# Patient Record
Sex: Female | Born: 1985 | Hispanic: No | Marital: Married | Smoking: Never smoker
Health system: Southern US, Community
[De-identification: ages and names within clinical notes are randomized; demographics above are authoritative.]

## PROBLEM LIST (undated history)

## (undated) DIAGNOSIS — Z202 Contact with and (suspected) exposure to infections with a predominantly sexual mode of transmission: Secondary | ICD-10-CM

## (undated) DIAGNOSIS — B009 Herpesviral infection, unspecified: Secondary | ICD-10-CM

## (undated) HISTORY — DX: Contact with and (suspected) exposure to infections with a predominantly sexual mode of transmission: Z20.2

## (undated) HISTORY — PX: WISDOM TOOTH EXTRACTION: SHX21

---

## 2008-06-09 DIAGNOSIS — Z202 Contact with and (suspected) exposure to infections with a predominantly sexual mode of transmission: Secondary | ICD-10-CM

## 2008-06-09 HISTORY — DX: Contact with and (suspected) exposure to infections with a predominantly sexual mode of transmission: Z20.2

## 2012-08-24 ENCOUNTER — Encounter: Payer: Self-pay | Admitting: Certified Nurse Midwife

## 2012-08-25 ENCOUNTER — Other Ambulatory Visit: Payer: Self-pay | Admitting: Certified Nurse Midwife

## 2012-08-25 NOTE — Telephone Encounter (Signed)
REFILL ON dEPO pROVERA SENT TO [facility].

## 2012-08-30 ENCOUNTER — Encounter: Payer: Self-pay | Admitting: Certified Nurse Midwife

## 2012-08-30 ENCOUNTER — Ambulatory Visit (INDEPENDENT_AMBULATORY_CARE_PROVIDER_SITE_OTHER): Payer: BC Managed Care – PPO | Admitting: Certified Nurse Midwife

## 2012-08-30 VITALS — BP 92/60 | HR 72 | Resp 16

## 2012-08-30 DIAGNOSIS — IMO0001 Reserved for inherently not codable concepts without codable children: Secondary | ICD-10-CM

## 2012-08-30 DIAGNOSIS — Z309 Encounter for contraceptive management, unspecified: Secondary | ICD-10-CM

## 2012-08-30 MED ORDER — LEVONORGEST-ETH ESTRAD 91-DAY 0.15-0.03 &0.01 MG PO TABS: 1.0000 | ORAL_TABLET | Freq: Every day | ORAL | Status: DC

## 2012-08-30 NOTE — Progress Notes (Addendum)
27 yo maaf [id number] here to discuss change of contraception from Depo Provera to oral contraception.  Has been on Depo for 9 months with spotting off and on all the time.  Also noted headaches and fatigue with Depo Provera.  Last Depo due 08-04-12. Patient reports spotting on 08-25-12 with negative pregnancy test. Plans to set phone for regularity of pills.  Interested in extended cycle pills due to dysmenorrhea in the past. Denies smoking,migraine headaches or DVT history.  O:Heathy female, WD WN           HPI: pertinent information  above Orientation appropriate x3  No exam today  Last aex 12-09-11 WNL  A: Healthy Female desires contraceptive change  P: Discussed risks and benefits with handout regarding oral contraceptives(has used in the past) Desires trial [employer] x2 refills with instruction and warning signs and symptoms given.  Questions addressed.  RV annual exam 7-14.   Face to face regarding contraceptive change   Time 20 minutes Reviewed, TL

## 2012-08-30 NOTE — Patient Instructions (Signed)
Hormonal Contraception Information Estrogen and progesterone (progestin) are hormones used in many forms of birth control (contraception). These 2 hormones make up most hormonal contraceptives. Hormonal contraceptives use either:   A combination of estrogen hormone and progesterone hormone in the form of a:  Pill. Typical pill packs include 21 days of active hormone pills and 7 days of non-hormonal pills.During the non-hormone week, you will have your period. There are certain types of pills that include more days of active hormones.  Patch. The patch is placed on the lower abdomen every week for 3 weeks, and not on the fourth week.  Vaginal ring. The ring is placed in the vagina and left there for 3 weeks, and removed for 1 week.  Progesterone alone in the form of a(n):  Pill. Hormone pills are taken every day of the cycle.  Intrauterine device (IUD). The IUD is inserted during a menstrual period and removed or replaced every 5 years or less.  Implant. Plastic rods are placed under the skin of the upper arm and are removed or replaced every 3 years or less.  Injection. The injection is given once every 90 days. Pregnancy can still occur with any of these hormonal contraceptive methods. If there is any suspicion of pregnancy, take a pregnancy test and talk to your caregiver. ESTROGEN AND PROGESTERONE CONTRACEPTIVES Estrogen and progesterone contraceptives can prevent pregnancy by:  Stopping the actions of other reproductive hormones.   Stopping the release of an egg (ovulation).  Changing the lining of the uterus. This change makes it more difficult for an egg to implant. Side effects from estrogen occur more often in the first 2 or 3 months. Talk to your caregiver about what side effects may affect you. If you develop persistent side effects or they are severe, talk to your caregiver. PROGESTERONE CONTRACEPTIVES Progesterone only contraceptives can prevent pregnancy by:   Blocking  ovulation.  Preventing the entry of sperm into the uterus by keeping the cervical mucus thick and sticky.  Slowing the action of fallopian tubes to slow sperm transport.  Changing the lining of the uterus. This change makes it more difficult for an egg to implant. Side effects of progesterone can vary. Talk to your caregiver about what side effects may affect you. If you develop persistent side effects or they are severe, talk to your caregiver. Document Released: 06/15/2007 Document Revised: 08/18/2011 Document Reviewed: 09/28/2010 [facility] Patient Information 2013 [place], [place].

## 2012-09-02 ENCOUNTER — Telehealth: Payer: Self-pay | Admitting: Certified Nurse Midwife

## 2012-09-02 NOTE — Telephone Encounter (Signed)
LEFT MESSAGE TO RETURN CALL TO OUR OFFICE. Holly Reeves

## 2012-09-02 NOTE — Telephone Encounter (Signed)
PT HAS A QUESTION CONCERNING HER BIRTH CONTROL. PT THOUGHT HER CYCLE WOULD START IN 3 WEEKS BUT SHE HAS STARTED TODAY. SHOULD SHE START THE NEW BC SEASONIQUE TODAY?

## 2012-09-03 NOTE — Telephone Encounter (Signed)
Patient called to get advise on starting birth control oral meds that have been ordered, seasonique. Patient missed Depo Shot  07/11/2012. Her last cycle was on 3/19  Wed.  Last week. Yesterday she had cramping with red bleeding and today also. While on depo it was always a dark color. Question should she go ahead and start Seasonique. ? Stated she did do a HGC was negative. Please advise. Fannie Knee  See your Epic note.

## 2012-09-05 NOTE — Telephone Encounter (Signed)
Pt can start the ocp, use condoms if sexually active for the first month

## 2012-09-06 NOTE — Telephone Encounter (Signed)
Patient notified of D. Leonard,cnm, response to birth control. Patient understands directions. sue

## 2012-12-09 ENCOUNTER — Ambulatory Visit: Payer: Self-pay | Admitting: Certified Nurse Midwife

## 2012-12-17 ENCOUNTER — Encounter: Payer: Self-pay | Admitting: Certified Nurse Midwife

## 2012-12-17 ENCOUNTER — Ambulatory Visit (INDEPENDENT_AMBULATORY_CARE_PROVIDER_SITE_OTHER): Payer: BC Managed Care – PPO | Admitting: Certified Nurse Midwife

## 2012-12-17 VITALS — BP 100/64 | HR 60 | Resp 16 | Ht 63.25 in | Wt 134.0 lb

## 2012-12-17 DIAGNOSIS — IMO0001 Reserved for inherently not codable concepts without codable children: Secondary | ICD-10-CM

## 2012-12-17 DIAGNOSIS — Z01419 Encounter for gynecological examination (general) (routine) without abnormal findings: Secondary | ICD-10-CM

## 2012-12-17 DIAGNOSIS — Z0000 Encounter for general adult medical examination without abnormal findings: Secondary | ICD-10-CM

## 2012-12-17 DIAGNOSIS — Z23 Encounter for immunization: Secondary | ICD-10-CM

## 2012-12-17 DIAGNOSIS — Z309 Encounter for contraceptive management, unspecified: Secondary | ICD-10-CM

## 2012-12-17 MED ORDER — LEVONORGEST-ETH ESTRAD 91-DAY 0.15-0.03 &0.01 MG PO TABS: 1.0000 | ORAL_TABLET | Freq: Every day | ORAL | Status: DC

## 2012-12-17 NOTE — Progress Notes (Signed)
27 y.o. [id number] Married African American Fe here for annual exam.  Periods  Normal, no issues. Contraception working well with extended cycle OCP. Desires GC,Chlamydia screening.  No health issues today.   Patient's last menstrual period was 12/13/2012.          Sexually active: yes  The current method of family planning is OCP (estrogen/progesterone).    Exercising: no  exercise Smoker:  no  Health Maintenance: Pap:  12-09-11 neg MMG:  none Colonoscopy:  none BMD:   none TDaP:  Unsure per patient feels she is up to date. Labs: none Self breast exam: not done   reports that she has never smoked. She does not have any smokeless tobacco history on file. She reports that she does not drink alcohol or use illicit drugs.  Past Medical History  Diagnosis Date  . Chlamydia contact, treated 2010    History reviewed. No pertinent past surgical history.  Current Outpatient Prescriptions  Medication Sig Dispense Refill  . Levonorgestrel-Ethinyl Estradiol (SEASONIQUE) 0.15-0.03 &0.01 MG tablet Take 1 tablet by mouth daily.  1 Package  1   No current facility-administered medications for this visit.    Family History  Problem Relation Age of Onset  . Hypertension Mother   . Hypertension Maternal Grandmother   . Heart disease Maternal Grandmother   . Heart disease Maternal Grandfather   . Depression Mother     ROS:  Pertinent items are noted in HPI.  Otherwise, a comprehensive ROS was negative.  Exam:   BP 100/64  Pulse 60  Resp 16  Ht 5' 3.25" (1.607 m)  Wt 134 lb (60.782 kg)  BMI 23.54 kg/m2  LMP 12/13/2012 Height: 5' 3.25" (160.7 cm)  Ht Readings from Last 3 Encounters:  12/17/12 5' 3.25" (1.607 m)    General appearance: alert, cooperative and appears stated age Head: Normocephalic, without obvious abnormality, atraumatic Neck: no adenopathy, supple, symmetrical, trachea midline and thyroid normal to inspection and palpation Lungs: clear to auscultation  bilaterally Breasts: normal appearance, no masses or tenderness, No nipple retraction or dimpling, No nipple discharge or bleeding, No axillary or supraclavicular adenopathy Heart: regular rate and rhythm Abdomen: soft, non-tender; no masses,  no organomegaly Extremities: extremities normal, atraumatic, no cyanosis or edema Skin: Skin color, texture, turgor normal. No rashes or lesions Lymph nodes: Cervical, supraclavicular, and axillary nodes normal. No abnormal inguinal nodes palpated Neurologic: Grossly normal   Pelvic: External genitalia:  no lesions              Urethra:  normal appearing urethra with no masses, tenderness or lesions              Bartholin's and Skene's: normal                 Vagina: normal appearing vagina with normal color and discharge, no lesions              Cervix: normal, non tender              Pap taken: no Bimanual Exam:  Uterus:  normal size, contour, position, consistency, mobility, non-tender and anteverted              Adnexa: normal adnexa and no mass, fullness, tenderness               Rectovaginal: Confirms               Anus:  normal sphincter tone, no lesions  A:  Well Woman with normal  exam  Contraception: OCP  STD screening  Gardasil candidate    P:   Reviewed health and wellness   Rx Seasonique see order  Lab: GC,Chlamydia  Discussed risks and benefits, request Gardasil series  Pap smear as per guidelines   pap smear not taken today  counseled on breast self exam, STD prevention, use and side effects of OCP's, adequate intake of calcium and vitamin D, diet and exercise  return annually or prn  An After Visit Summary was printed and given to the patient.  Reviewed, TL

## 2012-12-17 NOTE — Patient Instructions (Addendum)
General topics  Next pap or exam is  due in 1 year Take a Women's multivitamin Take 1200 mg. of calcium daily - prefer dietary If any concerns in interim to call back  Breast Self-Awareness Practicing breast self-awareness may pick up problems early, prevent significant medical complications, and possibly save your life. By practicing breast self-awareness, you can become familiar with how your breasts look and feel and if your breasts are changing. This allows you to notice changes early. It can also offer you some reassurance that your breast health is good. One way to learn what is normal for your breasts and whether your breasts are changing is to do a breast self-exam. If you find a lump or something that was not present in the past, it is best to contact your caregiver right away. Other findings that should be evaluated by your caregiver include nipple discharge, especially if it is bloody; skin changes or reddening; areas where the skin seems to be pulled in (retracted); or new lumps and bumps. Breast pain is seldom associated with cancer (malignancy), but should also be evaluated by a caregiver. BREAST SELF-EXAM The best time to examine your breasts is 5 7 days after your menstrual period is over.  ExitCare Patient Information 2013 [employer].   Exercise to Stay Healthy Exercise helps you become and stay healthy. EXERCISE IDEAS AND TIPS Choose exercises that:  You enjoy.  Fit into your day. You do not need to exercise really hard to be healthy. You can do exercises at a slow or medium level and stay healthy. You can:  Stretch before and after working out.  Try yoga, Pilates, or tai chi.  Lift weights.  Walk fast, swim, jog, run, climb stairs, bicycle, dance, or rollerskate.  Take aerobic classes. Exercises that burn about 150 calories:  Running 1  miles in 15 minutes.  Playing volleyball for 45 to 60 minutes.  Washing and waxing a car for 45 to 60  minutes.  Playing touch football for 45 minutes.  Walking 1  miles in 35 minutes.  Pushing a stroller 1  miles in 30 minutes.  Playing basketball for 30 minutes.  Raking leaves for 30 minutes.  Bicycling 5 miles in 30 minutes.  Walking 2 miles in 30 minutes.  Dancing for 30 minutes.  Shoveling snow for 15 minutes.  Swimming laps for 20 minutes.  Walking up stairs for 15 minutes.  Bicycling 4 miles in 15 minutes.  Gardening for 30 to 45 minutes.  Jumping rope for 15 minutes.  Washing windows or floors for 45 to 60 minutes. Document Released: 06/28/2010 Document Revised: 08/18/2011 Document Reviewed: 06/28/2010 [employer] Patient Information 2013 [employer].   Other topics ( that may be useful information):    Sexually Transmitted Disease Sexually transmitted disease (STD) refers to any infection that is passed from person to person during sexual activity. This may happen by way of saliva, semen, blood, vaginal mucus, or urine. Common STDs include:  Gonorrhea.  Chlamydia.  Syphilis.  HIV/AIDS.  Genital herpes.  Hepatitis B and C.  Trichomonas.  Human papillomavirus (HPV).  Pubic lice. CAUSES  An STD may be spread by bacteria, virus, or parasite. A person can get an STD by:  Sexual intercourse with an infected person.  Sharing sex toys with an infected person.  Sharing needles with an infected person.  Having intimate contact with the genitals, mouth, or rectal areas of an infected person. SYMPTOMS  Some people may not have any symptoms, but   they can still pass the infection to others. Different STDs have different symptoms. Symptoms include:  Painful or bloody urination.  Pain in the pelvis, abdomen, vagina, anus, throat, or eyes.  Skin rash, itching, irritation, growths, or sores (lesions). These usually occur in the genital or anal area.  Abnormal vaginal discharge.  Penile discharge in men.  Soft, flesh-colored skin growths in the  genital or anal area.  Fever.  Pain or bleeding during sexual intercourse.  Swollen glands in the groin area.  Yellow skin and eyes (jaundice). This is seen with hepatitis. DIAGNOSIS  To make a diagnosis, your caregiver may:  Take a medical history.  Perform a physical exam.  Take a specimen (culture) to be examined.  Examine a sample of discharge under a microscope.  Perform blood test TREATMENT   Chlamydia, gonorrhea, trichomonas, and syphilis can be cured with antibiotic medicine.  Genital herpes, hepatitis, and HIV can be treated, but not cured, with prescribed medicines. The medicines will lessen the symptoms.  Genital warts from HPV can be treated with medicine or by freezing, burning (electrocautery), or surgery. Warts may come back.  HPV is a virus and cannot be cured with medicine or surgery.However, abnormal areas may be followed very closely by your caregiver and may be removed from the cervix, vagina, or vulva through office procedures or surgery. If your diagnosis is confirmed, your recent sexual partners need treatment. This is true even if they are symptom-free or have a negative culture or evaluation. They should not have sex until their caregiver says it is okay. HOME CARE INSTRUCTIONS  All sexual partners should be informed, tested, and treated for all STDs.  Take your antibiotics as directed. Finish them even if you start to feel better.  Only take over-the-counter or prescription medicines for pain, discomfort, or fever as directed by your caregiver.  Rest.  Eat a balanced diet and drink enough fluids to keep your urine clear or pale yellow.  Do not have sex until treatment is completed and you have followed up with your caregiver. STDs should be checked after treatment.  Keep all follow-up appointments, Pap tests, and blood tests as directed by your caregiver.  Only use latex condoms and water-soluble lubricants during sexual activity. Do not use  petroleum jelly or oils.  Avoid alcohol and illegal drugs.  Get vaccinated for HPV and hepatitis. If you have not received these vaccines in the past, talk to your caregiver about whether one or both might be right for you.  Avoid risky sex practices that can break the skin. The only way to avoid getting an STD is to avoid all sexual activity.Latex condoms and dental dams (for oral sex) will help lessen the risk of getting an STD, but will not completely eliminate the risk. SEEK MEDICAL CARE IF:   You have a fever.  You have any new or worsening symptoms. Document Released: 08/16/2002 Document Revised: 08/18/2011 Document Reviewed: 08/23/2010 [employer] Patient Information 2013 [employer].    Domestic Abuse You are being battered or abused if someone close to you hits, pushes, or physically hurts you in any way. You also are being abused if you are forced into activities. You are being sexually abused if you are forced to have sexual contact of any kind. You are being emotionally abused if you are made to feel worthless or if you are constantly threatened. It is important to remember that help is available. No one has the right to abuse you. PREVENTION OF FURTHER   ABUSE  Learn the warning signs of danger. This varies with situations but may include: the use of alcohol, threats, isolation from friends and family, or forced sexual contact. Leave if you feel that violence is going to occur.  If you are attacked or beaten, report it to the police so the abuse is documented. You do not have to press charges. The police can protect you while you or the attackers are leaving. Get the officer's name and badge number and a copy of the report.  Find someone you can trust and tell them what is happening to you: your caregiver, a nurse, clergy member, close friend or family member. Feeling ashamed is natural, but remember that you have done nothing wrong. No one deserves abuse. Document Released:  05/23/2000 Document Revised: 08/18/2011 Document Reviewed: 08/01/2010 [employer] Patient Information 2013 [employer].    How Much is Too Much Alcohol? Drinking too much alcohol can cause injury, accidents, and health problems. These types of problems can include:   Car crashes.  Falls.  Family fighting (domestic violence).  Drowning.  Fights.  Injuries.  Burns.  Damage to certain organs.  Having a baby with birth defects. ONE DRINK CAN BE TOO MUCH WHEN YOU ARE:  Working.  Pregnant or breastfeeding.  Taking medicines. Ask your doctor.  Driving or planning to drive. If you or someone you know has a drinking problem, get help from a doctor.  Document Released: 03/22/2009 Document Revised: 08/18/2011 Document Reviewed: 03/22/2009 [employer] Patient Information 2013 [employer].   Smoking Hazards Smoking cigarettes is extremely bad for your health. Tobacco smoke has over 200 known poisons in it. There are over 60 chemicals in tobacco smoke that cause cancer. Some of the chemicals found in cigarette smoke include:   Cyanide.  Benzene.  Formaldehyde.  Methanol (wood alcohol).  Acetylene (fuel used in welding torches).  Ammonia. Cigarette smoke also contains the poisonous gases nitrogen oxide and carbon monoxide.  Cigarette smokers have an increased risk of many serious medical problems and Smoking causes approximately:  90% of all lung cancer deaths in men.  80% of all lung cancer deaths in women.  90% of deaths from chronic obstructive lung disease. Compared with nonsmokers, smoking increases the risk of:  Coronary heart disease by 2 to 4 times.  Stroke by 2 to 4 times.  Men developing lung cancer by 23 times.  Women developing lung cancer by 13 times.  Dying from chronic obstructive lung diseases by 12 times.  . Smoking is the most preventable cause of death and disease in our society.  WHY IS SMOKING ADDICTIVE?  Nicotine is the chemical  agent in tobacco that is capable of causing addiction or dependence.  When you smoke and inhale, nicotine is absorbed rapidly into the bloodstream through your lungs. Nicotine absorbed through the lungs is capable of creating a powerful addiction. Both inhaled and non-inhaled nicotine may be addictive.  Addiction studies of cigarettes and spit tobacco show that addiction to nicotine occurs mainly during the teen years, when young people begin using tobacco products. WHAT ARE THE BENEFITS OF QUITTING?  There are many health benefits to quitting smoking.   Likelihood of developing cancer and heart disease decreases. Health improvements are seen almost immediately.  Blood pressure, pulse rate, and breathing patterns start returning to normal soon after quitting. QUITTING SMOKING   American Lung Association - 1-800-LUNGUSA  American Cancer Society - 1-800-ACS-2345 Document Released: 07/03/2004 Document Revised: 08/18/2011 Document Reviewed: 03/07/2009 [employer] Patient Information 2013 [employer].   Stress Management Stress is a state of physical or mental tension that often results from changes in your life or normal routine. Some common causes of stress are:  Death of a loved one.  Injuries or severe illnesses.  Getting fired or changing jobs.  Moving into a new home. Other causes may be:  Sexual problems.  Business or financial losses.  Taking on a large debt.  Regular conflict with someone at home or at work.  Constant tiredness from lack of sleep. It is not just bad things that are stressful. It may be stressful to:  Win the lottery.  Get married.  Buy a new car. The amount of stress that can be easily tolerated varies from person to person. Changes generally cause stress, regardless of the types of change. Too much stress can affect your health. It may lead to physical or emotional problems. Too little stress (boredom) may also become stressful. SUGGESTIONS TO  REDUCE STRESS:  Talk things over with your family and friends. It often is helpful to share your concerns and worries. If you feel your problem is serious, you may want to get help from a professional counselor.  Consider your problems one at a time instead of lumping them all together. Trying to take care of everything at once may seem impossible. List all the things you need to do and then start with the most important one. Set a goal to accomplish 2 or 3 things each day. If you expect to do too many in a single day you will naturally fail, causing you to feel even more stressed.  Do not use alcohol or drugs to relieve stress. Although you may feel better for a short time, they do not remove the problems that caused the stress. They can also be habit forming.  Exercise regularly - at least 3 times per week. Physical exercise can help to relieve that "uptight" feeling and will relax you.  The shortest distance between despair and hope is often a good night's sleep.  Go to bed and get up on time allowing yourself time for appointments without being rushed.  Take a short "time-out" period from any stressful situation that occurs during the day. Close your eyes and take some deep breaths. Starting with the muscles in your face, tense them, hold it for a few seconds, then relax. Repeat this with the muscles in your neck, shoulders, hand, stomach, back and legs.  Take good care of yourself. Eat a balanced diet and get plenty of rest.  Schedule time for having fun. Take a break from your daily routine to relax. HOME CARE INSTRUCTIONS   Call if you feel overwhelmed by your problems and feel you can no longer manage them on your own.  Return immediately if you feel like hurting yourself or someone else. Document Released: 11/19/2000 Document Revised: 08/18/2011 Document Reviewed: 07/12/2007 [employer] Patient Information 2013 [employer].   

## 2012-12-22 LAB — IPS N GONORRHOEA AND CHLAMYDIA BY PCR

## 2012-12-23 ENCOUNTER — Telehealth: Payer: Self-pay

## 2012-12-23 NOTE — Telephone Encounter (Signed)
[email or web address]

## 2012-12-23 NOTE — Telephone Encounter (Signed)
Message copied by Eliezer Bottom on Thu Dec 23, 2012  9:16 AM ------      Message from: Verner Chol      Created: Thu Dec 23, 2012  8:08 AM       Notify GC,Chlamydia negative ------

## 2012-12-29 NOTE — Telephone Encounter (Signed)
Patient notified of GC/CHL see labs

## 2013-02-17 ENCOUNTER — Ambulatory Visit: Payer: BC Managed Care – PPO

## 2013-03-18 ENCOUNTER — Ambulatory Visit (INDEPENDENT_AMBULATORY_CARE_PROVIDER_SITE_OTHER): Payer: BC Managed Care – PPO | Admitting: *Deleted

## 2013-03-18 VITALS — BP 110/74 | HR 68 | Temp 98.8°F | Ht 63.25 in | Wt 137.0 lb

## 2013-03-18 DIAGNOSIS — Z23 Encounter for immunization: Secondary | ICD-10-CM

## 2013-03-18 NOTE — Progress Notes (Signed)
Patient ID: Holly Reeves, female   DOB: [birth year], 27 y.o.   MRN: [id number]  Pt arrived for 2nd Gardasil injection.  Pt denies complications from previous injection.  Pt tolerated well in right deltoid.  She will return after 04/19/13 for next injection.

## 2013-03-18 NOTE — Patient Instructions (Signed)
Please return after 04/19/13 for 3rd and last Gardasil injection.

## 2013-04-22 ENCOUNTER — Ambulatory Visit: Payer: BC Managed Care – PPO

## 2013-05-02 ENCOUNTER — Ambulatory Visit: Payer: BC Managed Care – PPO

## 2013-08-04 ENCOUNTER — Ambulatory Visit: Payer: Self-pay

## 2013-08-17 ENCOUNTER — Ambulatory Visit (INDEPENDENT_AMBULATORY_CARE_PROVIDER_SITE_OTHER): Payer: BC Managed Care – PPO | Admitting: *Deleted

## 2013-08-17 VITALS — BP 115/72 | HR 83 | Resp 18 | Wt 144.0 lb

## 2013-08-17 DIAGNOSIS — Z23 Encounter for immunization: Secondary | ICD-10-CM

## 2013-08-17 NOTE — Progress Notes (Signed)
3rd gardasil Injection given left deltoid IM. Pt tolerated injection well.

## 2013-12-23 ENCOUNTER — Ambulatory Visit: Payer: BC Managed Care – PPO | Admitting: Certified Nurse Midwife

## 2014-01-11 ENCOUNTER — Ambulatory Visit: Payer: BC Managed Care – PPO | Admitting: Certified Nurse Midwife

## 2014-01-11 ENCOUNTER — Encounter: Payer: Self-pay | Admitting: Certified Nurse Midwife

## 2014-04-21 ENCOUNTER — Ambulatory Visit: Payer: BC Managed Care – PPO | Admitting: Certified Nurse Midwife

## 2014-04-24 ENCOUNTER — Encounter (HOSPITAL_COMMUNITY): Payer: Self-pay | Admitting: *Deleted

## 2014-04-24 ENCOUNTER — Inpatient Hospital Stay (HOSPITAL_COMMUNITY): Payer: BC Managed Care – PPO

## 2014-04-24 ENCOUNTER — Telehealth: Payer: Self-pay | Admitting: Certified Nurse Midwife

## 2014-04-24 ENCOUNTER — Inpatient Hospital Stay (HOSPITAL_COMMUNITY)
Admission: AD | Admit: 2014-04-24 | Discharge: 2014-04-24 | Disposition: A | Discharge status: Home / Self Care | Payer: BC Managed Care – PPO | Source: Ambulatory Visit | Attending: Obstetrics and Gynecology | Admitting: Obstetrics and Gynecology

## 2014-04-24 DIAGNOSIS — O200 Threatened abortion: Secondary | ICD-10-CM | POA: Diagnosis not present

## 2014-04-24 DIAGNOSIS — Z3A13 13 weeks gestation of pregnancy: Secondary | ICD-10-CM | POA: Insufficient documentation

## 2014-04-24 DIAGNOSIS — O209 Hemorrhage in early pregnancy, unspecified: Secondary | ICD-10-CM | POA: Diagnosis present

## 2014-04-24 LAB — HCG, QUANTITATIVE, PREGNANCY: hCG, Beta Chain, Quant, S: 1955 m[IU]/mL — ABNORMAL HIGH (ref <5)

## 2014-04-24 LAB — URINALYSIS, ROUTINE W REFLEX MICROSCOPIC
BILIRUBIN URINE: NEGATIVE
Glucose, UA: NEGATIVE mg/dL
Ketones, ur: NEGATIVE mg/dL
Leukocytes, UA: NEGATIVE
Nitrite: NEGATIVE
PROTEIN: NEGATIVE mg/dL
Specific Gravity, Urine: >1.03 — ABNORMAL HIGH (ref 1.005–1.030)
UROBILINOGEN UA: 0.2 mg/dL (ref 0.0–1.0)
pH: 6 (ref 5.0–8.0)

## 2014-04-24 LAB — WET PREP, GENITAL
Trich, Wet Prep: NONE SEEN
Yeast Wet Prep HPF POC: NONE SEEN

## 2014-04-24 LAB — CBC
HCT: 39.3 % (ref 36.0–46.0)
Hemoglobin: 13.7 g/dL (ref 12.0–15.0)
MCH: 32.8 pg (ref 26.0–34.0)
MCHC: 34.9 g/dL (ref 30.0–36.0)
MCV: 94 fL (ref 78.0–100.0)
PLATELETS: 297 10*3/uL (ref 150–400)
RBC: 4.18 MIL/uL (ref 3.87–5.11)
RDW: 12.7 % (ref 11.5–15.5)
WBC: 4.4 10*3/uL (ref 4.0–10.5)

## 2014-04-24 LAB — POCT PREGNANCY, URINE: Preg Test, Ur: POSITIVE — AB

## 2014-04-24 LAB — URINE MICROSCOPIC-ADD ON

## 2014-04-24 LAB — ABO/RH: ABO/RH(D): O POS

## 2014-04-24 NOTE — MAU Note (Signed)
Pt 13 weeks and started with pink/brown spotting one week ago,  Bleeding progressed to red mod amt bleeding for the past three days.  Mild abd cramping; no problems when she voids.

## 2014-04-24 NOTE — Telephone Encounter (Signed)
Patient calling to report bleeding after a positive urine pregnancy test.

## 2014-04-24 NOTE — Discharge Instructions (Signed)
Threatened Miscarriage °A threatened miscarriage occurs when you have vaginal bleeding during your first 20 weeks of pregnancy but the pregnancy has not ended. If you have vaginal bleeding during this time, your health care provider will do tests to make sure you are still pregnant. If the tests show you are still pregnant and the developing baby (fetus) inside your womb (uterus) is still growing, your condition is considered a threatened miscarriage. °A threatened miscarriage does not mean your pregnancy will end, but it does increase the risk of losing your pregnancy (complete miscarriage). °CAUSES  °The cause of a threatened miscarriage is usually not known. If you go on to have a complete miscarriage, the most common cause is an abnormal number of chromosomes in the developing baby. Chromosomes are the structures inside cells that hold all your genetic material. °Some causes of vaginal bleeding that do not result in miscarriage include: °· Having sex. °· Having an infection. °· Normal hormone changes of pregnancy. °· Bleeding that occurs when an egg implants in your uterus. °RISK FACTORS °Risk factors for bleeding in early pregnancy include: °· Obesity. °· Smoking. °· Drinking excessive amounts of alcohol or caffeine. °· Recreational drug use. °SIGNS AND SYMPTOMS °· Light vaginal bleeding. °· Mild abdominal pain or cramps. °DIAGNOSIS  °If you have bleeding with or without abdominal pain before 20 weeks of pregnancy, your health care provider will do tests to check whether you are still pregnant. One important test involves using sound waves and a computer (ultrasound) to create images of the inside of your uterus. Other tests include an internal exam of your vagina and uterus (pelvic exam) and measurement of your baby's heart rate.  °You may be diagnosed with a threatened miscarriage if: °· Ultrasound testing shows you are still pregnant. °· Your baby's heart rate is strong. °· A pelvic exam shows that the  opening between your uterus and your vagina (cervix) is closed. °· Your heart rate and blood pressure are stable. °· Blood tests confirm you are still pregnant. °TREATMENT  °No treatments have been shown to prevent a threatened miscarriage from going on to a complete miscarriage. However, the right home care is important.  °HOME CARE INSTRUCTIONS  °· Make sure you keep all your appointments for prenatal care. This is very important. °· Get plenty of rest. °· Do not have sex or use tampons if you have vaginal bleeding. °· Do not douche. °· Do not smoke or use recreational drugs. °· Do not drink alcohol. °· Avoid caffeine. °SEEK MEDICAL CARE IF: °· You have light vaginal bleeding or spotting while pregnant. °· You have abdominal pain or cramping. °· You have a fever. °SEEK IMMEDIATE MEDICAL CARE IF: °· You have heavy vaginal bleeding. °· You have blood clots coming from your vagina. °· You have severe low back pain or abdominal cramps. °· You have fever, chills, and severe abdominal pain. °MAKE SURE YOU: °· Understand these instructions. °· Will watch your condition. °· Will get help right away if you are not doing well or get worse. °Document Released: 05/26/2005 Document Revised: 05/31/2013 Document Reviewed: 03/22/2013 °ExitCare® Patient Information ©2015 [employer]. This information is not intended to replace advice given to you by your health care provider. Make sure you discuss any questions you have with your health care provider. ° °Pelvic Rest °Pelvic rest is sometimes recommended for women when:  °· The placenta is partially or completely covering the opening of the cervix (placenta previa). °· There is bleeding between the   uterine wall and the amniotic sac in the first trimester (subchorionic hemorrhage). °· The cervix begins to open without labor starting (incompetent cervix, cervical insufficiency). °· The labor is too early (preterm labor). °HOME CARE INSTRUCTIONS °· Do not have sexual intercourse,  stimulation, or an orgasm. °· Do not use tampons, douche, or put anything in the vagina. °· Do not lift anything over 10 pounds (4.5 kg). °· Avoid strenuous activity or straining your pelvic muscles. °SEEK MEDICAL CARE IF:  °· You have any vaginal bleeding during pregnancy. Treat this as a potential emergency. °· You have cramping pain felt low in the stomach (stronger than menstrual cramps). °· You notice vaginal discharge (watery, mucus, or bloody). °· You have a low, dull backache. °· There are regular contractions or uterine tightening. °SEEK IMMEDIATE MEDICAL CARE IF: °You have vaginal bleeding and have placenta previa.  °Document Released: 09/20/2010 Document Revised: 08/18/2011 Document Reviewed: 09/20/2010 °ExitCare® Patient Information ©2015 [employer]. This information is not intended to replace advice given to you by your health care provider. Make sure you discuss any questions you have with your health care provider. ° °

## 2014-04-24 NOTE — Telephone Encounter (Signed)
Initially spoke to patient at 1215pm. Reports positive UPT at home and has had bleeding off an on since last Tuesday and wanted to know if this was normal?  Bleeding started as pinkish brown and is now more bloody. States pregnancy test was positive 3-4 weeks ago. LMP 01-19-14. G1 P1. Denies pain. Had some cramping a few days ago but has resolved now.  Denies symptoms of pregnancy, morning sickness is gone. States she is not showing.   Advised will need to review with Dr Edward JollySilva and call her back.  Reviewed with Dr Edward JollySilva. Based on EGA 13+3 weeks, patient needs to go to MAU for evaluation. Call to patient at 1244 and notified on MD instructions. Patient states she will go.  Routing to provider for final review. Patient agreeable to disposition. Will close encounter  CC: Holly Reeves CNM, FYI.

## 2014-04-24 NOTE — MAU Provider Note (Signed)
History     CSN: [id number]  Arrival date and time: 04/24/14 1752   First Provider Initiated Contact with Patient 04/24/14 1835      No chief complaint on file.  HPI Holly Reeves 28 y.o. [id number] @Unknown  presents to MAU complaining of vaginal bleeding.  LMP 01/20/14.  She has not established care for pregnancy.  HPT was positive early October.  6 days ago she started having spotting.  It has now become bleeding.  She is using 2 pads per day.  The blood is dark red.  She denies abdominal pain, nausea, vomiting, fever, weakness, dizziness, headache, dysuria.   OB History    Gravida Para Term Preterm AB TAB SAB Ectopic Multiple Living   3 1 1  2 1 1   1       Past Medical History  Diagnosis Date  . Chlamydia contact, treated 2010    Past Surgical History  Procedure Laterality Date  . Wisdom tooth extraction      Family History  Problem Relation Age of Onset  . Hypertension Mother   . Hypertension Maternal Grandmother   . Heart disease Maternal Grandmother   . Heart disease Maternal Grandfather   . Depression Mother     History  Substance Use Topics  . Smoking status: Never Smoker   . Smokeless tobacco: Not on file  . Alcohol Use: No    Allergies: No Known Allergies  Prescriptions prior to admission  Medication Sig Dispense Refill Last Dose  . Levonorgestrel-Ethinyl Estradiol (SEASONIQUE) 0.15-0.03 &0.01 MG tablet Take 1 tablet by mouth daily. 1 Package 4 More than a month at Unknown time    ROS Pertinent ROS in HPI Physical Exam   Last menstrual period 01/20/2014.  Physical Exam  Constitutional: She is oriented to person, place, and time. She appears well-developed and well-nourished.  HENT:  Head: Normocephalic and atraumatic.  Eyes: EOM are normal.  Neck: Normal range of motion.  Cardiovascular: Normal rate, regular rhythm and normal heart sounds.   Respiratory: Effort normal and breath sounds normal. No respiratory distress.  GI: Soft. Bowel  sounds are normal. She exhibits no distension. There is no tenderness. There is no rebound and no guarding.  Genitourinary:  Small amt of dark red/brown blood in vagina.  Malodor noted.   No CMT, no adnexal tenderness or mass noted.   Musculoskeletal: Normal range of motion.  Neurological: She is alert and oriented to person, place, and time.  Skin: Skin is warm and dry.  Psychiatric: She has a normal mood and affect.   Results for orders placed or performed during the hospital encounter of 04/24/14 (from the past 24 hour(s))  Urinalysis, Routine w reflex microscopic     Status: Abnormal   Collection Time: 04/24/14  6:15 PM  Result Value Ref Range   Color, Urine YELLOW YELLOW   APPearance CLEAR CLEAR   Specific Gravity, Urine >1.030 (H) 1.005 - 1.030   pH 6.0 5.0 - 8.0   Glucose, UA NEGATIVE NEGATIVE mg/dL   Hgb urine dipstick LARGE (A) NEGATIVE   Bilirubin Urine NEGATIVE NEGATIVE   Ketones, ur NEGATIVE NEGATIVE mg/dL   Protein, ur NEGATIVE NEGATIVE mg/dL   Urobilinogen, UA 0.2 0.0 - 1.0 mg/dL   Nitrite NEGATIVE NEGATIVE   Leukocytes, UA NEGATIVE NEGATIVE  Urine microscopic-add on     Status: Abnormal   Collection Time: 04/24/14  6:15 PM  Result Value Ref Range   Squamous Epithelial / LPF RARE RARE   WBC, UA 0-2 <  3 WBC/hpf   RBC / HPF 0-2 <3 RBC/hpf   Bacteria, UA FEW (A) RARE   Urine-Other MUCOUS PRESENT   Pregnancy, urine POC     Status: Abnormal   Collection Time: 04/24/14  7:09 PM  Result Value Ref Range   Preg Test, Ur POSITIVE (A) NEGATIVE  Wet prep, genital     Status: Abnormal   Collection Time: 04/24/14  7:30 PM  Result Value Ref Range   Yeast Wet Prep HPF POC NONE SEEN NONE SEEN   Trich, Wet Prep NONE SEEN NONE SEEN   Clue Cells Wet Prep HPF POC FEW (A) NONE SEEN   WBC, Wet Prep HPF POC FEW (A) NONE SEEN  CBC     Status: None   Collection Time: 04/24/14  7:45 PM  Result Value Ref Range   WBC 4.4 4.0 - 10.5 K/uL   RBC 4.18 3.87 - 5.11 MIL/uL   Hemoglobin 13.7  12.0 - 15.0 g/dL   HCT 16.1 09.6 - 04.5 %   MCV 94.0 78.0 - 100.0 fL   MCH 32.8 26.0 - 34.0 pg   MCHC 34.9 30.0 - 36.0 g/dL   RDW 40.9 81.1 - 91.4 %   Platelets 297 150 - 400 K/uL  ABO/Rh     Status: None (Preliminary result)   Collection Time: 04/24/14  7:45 PM  Result Value Ref Range   ABO/RH(D) O POS   hCG, quantitative, pregnancy     Status: Abnormal   Collection Time: 04/24/14  7:45 PM  Result Value Ref Range   hCG, Beta Chain, Quant, S 1955 (H) <5 mIU/mL   US Ob Comp Less 14 Wks  04/24/2014   CLINICAL DATA:  Vaginal bleeding  EXAM: OBSTETRIC <14 WK [place] AND TRANSVAGINAL OB US  TECHNIQUE: Both transabdominal and transvaginal ultrasound examinations were performed for complete evaluation of the gestation as well as the maternal uterus, adnexal regions, and pelvic cul-de-sac. Transvaginal technique was performed to assess early pregnancy.  COMPARISON:  None.  FINDINGS: Intrauterine gestational sac: An irregular area of decreased echogenicity is noted which may represent a gestational sac although it does not represent a normal appearing gestational sac. Given the patient's last menstrual period, gestational sac and fetal pole should be identified.  Maternal uterus/adnexae: Significant increased vascularity is noted surrounding the irregular hypoechoic structures in the endometrial canal. The ovaries are within normal limits bilaterally.  IMPRESSION: Irregular area of decreased echogenicity in the endometrial canal with significant increased vascularity. No definitive gestational sac is seen. Given the patient's current hemorrhage, this likely represents a miscarriage in progress. Correlation with the patient's pending beta HCG level is recommended. Followup examination could be performed as clinically indicated.   Electronically Signed   By: Alcide Clever M.D.   On: 04/24/2014 20:52   US Ob Transvaginal  04/24/2014   CLINICAL DATA:  Vaginal bleeding  EXAM: OBSTETRIC <14 WK [place] AND TRANSVAGINAL  OB US  TECHNIQUE: Both transabdominal and transvaginal ultrasound examinations were performed for complete evaluation of the gestation as well as the maternal uterus, adnexal regions, and pelvic cul-de-sac. Transvaginal technique was performed to assess early pregnancy.  COMPARISON:  None.  FINDINGS: Intrauterine gestational sac: An irregular area of decreased echogenicity is noted which may represent a gestational sac although it does not represent a normal appearing gestational sac. Given the patient's last menstrual period, gestational sac and fetal pole should be identified.  Maternal uterus/adnexae: Significant increased vascularity is noted surrounding the irregular hypoechoic structures in the endometrial  canal. The ovaries are within normal limits bilaterally.  IMPRESSION: Irregular area of decreased echogenicity in the endometrial canal with significant increased vascularity. No definitive gestational sac is seen. Given the patient's current hemorrhage, this likely represents a miscarriage in progress. Correlation with the patient's pending beta HCG level is recommended. Followup examination could be performed as clinically indicated.   Electronically Signed   By: Alcide CleverMark  Lukens M.D.   On: 04/24/2014 20:52   MAU Course  Procedures  MDM Pt's dates are certain and her periods are historically regular.  Per LMP she should be 13 weeks but no fetal heart tones and HCG is 1195.  U/S with irregular gest sac/no yolk sac or fetal pole.  Likely miscarriage in progress.  No evidence for infection.  Assessment and Plan  A:  1. Threatened abortion   2. Vaginal bleeding in pregnancy, first trimester    P: Discharge to home Pelvic rest Reasonable expectations discussed. Increase po fluids Return to MAU in 2 days for Quant HCG Patient may return to MAU as needed or if her condition were to change or worsen   Holly Reeves, Holly Reeves 04/24/2014, 6:35 PM

## 2014-04-25 LAB — HIV ANTIBODY (ROUTINE TESTING W REFLEX): HIV 1&2 Ab, 4th Generation: NONREACTIVE

## 2014-04-25 LAB — GC/CHLAMYDIA PROBE AMP
CT Probe RNA: NEGATIVE
GC Probe RNA: NEGATIVE

## 2014-04-26 ENCOUNTER — Ambulatory Visit: Payer: BC Managed Care – PPO | Admitting: Certified Nurse Midwife

## 2014-05-04 ENCOUNTER — Emergency Department (HOSPITAL_COMMUNITY)
Admission: EM | Admit: 2014-05-04 | Discharge: 2014-05-04 | Disposition: A | Discharge status: Home / Self Care | Payer: BC Managed Care – PPO | Attending: Emergency Medicine | Admitting: Emergency Medicine

## 2014-05-04 ENCOUNTER — Encounter (HOSPITAL_COMMUNITY): Payer: Self-pay

## 2014-05-04 DIAGNOSIS — O039 Complete or unspecified spontaneous abortion without complication: Secondary | ICD-10-CM | POA: Insufficient documentation

## 2014-05-04 DIAGNOSIS — O209 Hemorrhage in early pregnancy, unspecified: Secondary | ICD-10-CM | POA: Diagnosis present

## 2014-05-04 DIAGNOSIS — Z8619 Personal history of other infectious and parasitic diseases: Secondary | ICD-10-CM | POA: Diagnosis not present

## 2014-05-04 DIAGNOSIS — Z3A00 Weeks of gestation of pregnancy not specified: Secondary | ICD-10-CM | POA: Insufficient documentation

## 2014-05-04 LAB — CBC WITH DIFFERENTIAL/PLATELET
Basophils Absolute: 0 10*3/uL (ref 0.0–0.1)
Basophils Relative: 1 % (ref 0–1)
EOS ABS: 0 10*3/uL (ref 0.0–0.7)
Eosinophils Relative: 1 % (ref 0–5)
HCT: 38 % (ref 36.0–46.0)
Hemoglobin: 13.1 g/dL (ref 12.0–15.0)
LYMPHS ABS: 1.7 10*3/uL (ref 0.7–4.0)
LYMPHS PCT: 33 % (ref 12–46)
MCH: 32.1 pg (ref 26.0–34.0)
MCHC: 34.5 g/dL (ref 30.0–36.0)
MCV: 93.1 fL (ref 78.0–100.0)
Monocytes Absolute: 0.4 10*3/uL (ref 0.1–1.0)
Monocytes Relative: 7 % (ref 3–12)
Neutro Abs: 3.1 10*3/uL (ref 1.7–7.7)
Neutrophils Relative %: 58 % (ref 43–77)
PLATELETS: 304 10*3/uL (ref 150–400)
RBC: 4.08 MIL/uL (ref 3.87–5.11)
RDW: 12.3 % (ref 11.5–15.5)
WBC: 5.2 10*3/uL (ref 4.0–10.5)

## 2014-05-04 LAB — BASIC METABOLIC PANEL
Anion gap: 12 (ref 5–15)
BUN: 8 mg/dL (ref 6–23)
CO2: 26 mEq/L (ref 19–32)
Calcium: 9 mg/dL (ref 8.4–10.5)
Chloride: 102 mEq/L (ref 96–112)
Creatinine, Ser: 0.65 mg/dL (ref 0.50–1.10)
Glucose, Bld: 97 mg/dL (ref 70–99)
POTASSIUM: 3.5 meq/L — AB (ref 3.7–5.3)
SODIUM: 140 meq/L (ref 137–147)

## 2014-05-04 LAB — HCG, QUANTITATIVE, PREGNANCY: HCG, BETA CHAIN, QUANT, S: 208 m[IU]/mL — AB (ref <5)

## 2014-05-04 NOTE — ED Notes (Signed)
Pt was seen at MAU on 11/16.  Pt was confirmed pregnant, however she was bleeding.  Pt was advised it was possible a miscarriage.  Pt was advised to come back in 2 days for repeat labs, however she did not go for f/u.  Pt now presents today with c/o vaginal bleeding.

## 2014-05-04 NOTE — ED Provider Notes (Signed)
CSN: [id number]     Arrival date & time 05/04/14  0910 History   First MD Initiated Contact with Patient 05/04/14 [phone]     Chief Complaint  Patient presents with  . Vaginal Bleeding     (Consider location/radiation/quality/duration/timing/severity/associated sxs/prior Treatment) HPI  Barbaraann CaoKristin Cardenas is a 28 y.o. female who presents for evaluation of vaginal bleeding, like a menses, for 10 days.  The bleeding worsen last night, so she is concerned that she has had a miscarriage.  She was evaluated 04/24/2014 and diagnosed with pregnancy, and threatened abortion.  She decided not to follow-up because she felt like she was having a miscarriage at that time.  She denies weakness, dizziness, nausea, vomiting, fever, chills, back pain or vaginal discharge other than bleeding.  This is her second pregnancy, she has a six-year-old.  There are no other known modifying factors.   Past Medical History  Diagnosis Date  . Chlamydia contact, treated 2010   Past Surgical History  Procedure Laterality Date  . Wisdom tooth extraction     Family History  Problem Relation Age of Onset  . Hypertension Mother   . Hypertension Maternal Grandmother   . Heart disease Maternal Grandmother   . Heart disease Maternal Grandfather   . Depression Mother    History  Substance Use Topics  . Smoking status: Never Smoker   . Smokeless tobacco: Not on file  . Alcohol Use: No   OB History    Gravida Para Term Preterm AB TAB SAB Ectopic Multiple Living   3 1 1  2 1 1   1      Review of Systems  All other systems reviewed and are negative.     Allergies  Review of patient's allergies indicates no known allergies.  Home Medications   Prior to Admission medications   Medication Sig Start Date End Date Taking? Authorizing Provider  Prenatal Vit-Fe Fumarate-FA (PRENATAL MULTIVITAMIN) TABS tablet Take 1 tablet by mouth daily at 12 noon.   Yes Historical Provider, MD   BP 110/65 mmHg  Pulse 84   Temp(Src) 98.7 F (37.1 C) (Oral)  Resp 16  SpO2 100%  LMP 01/20/2014 (Exact Date) Physical Exam  Constitutional: She is oriented to person, place, and time. She appears well-developed and well-nourished. No distress.  HENT:  Head: Normocephalic and atraumatic.  Right Ear: External ear normal.  Left Ear: External ear normal.  Eyes: Conjunctivae and EOM are normal. Pupils are equal, round, and reactive to light.  Neck: Normal range of motion and phonation normal. Neck supple.  Cardiovascular: Normal rate, regular rhythm and normal heart sounds.   Pulmonary/Chest: Effort normal and breath sounds normal. She exhibits no bony tenderness.  Abdominal: Soft. There is no tenderness (no tenderness to palpation over the suprapubic and bilateral lower quadrants.). There is no guarding.  Musculoskeletal: Normal range of motion.  Neurological: She is alert and oriented to person, place, and time. No cranial nerve deficit or sensory deficit. She exhibits normal muscle tone. Coordination normal.  Skin: Skin is warm, dry and intact.  Psychiatric: She has a normal mood and affect. Her behavior is normal. Judgment and thought content normal.  Nursing note and vitals reviewed.   ED Course  Procedures (including critical care time)  Case Plan: Check HCG. If decreased appropriately, confirms SAB. Abdomen and vitals are benign. Doubt Pelvic exam needed as had one 11/16, with appropriate lab screening.  Medications - No data to display  Patient Vitals for the past 24 hrs:  BP Temp Temp src Pulse Resp SpO2  05/04/14 0930 110/65 mmHg - - 84 - 100 %  05/04/14 0919 119/72 mmHg 98.7 F (37.1 C) Oral 81 16 100 %    11:00 AM Reevaluation with update and discussion. After initial assessment and treatment, an updated evaluation reveals no further complaints.  She remains comfortable.  Findings discussed with patient, all questions answered.Mancel Bale. Olando Willems L    Labs Review Labs Reviewed  BASIC METABOLIC  PANEL - Abnormal; Notable for the following:    Potassium 3.5 (*)    All other components within normal limits  HCG, QUANTITATIVE, PREGNANCY - Abnormal; Notable for the following:    hCG, Beta Chain, Quant, S 208 (*)    All other components within normal limits  CBC WITH DIFFERENTIAL    Imaging Review No results found.   EKG Interpretation None      MDM   Final diagnoses:  Spontaneous abortion    Vaginal bleeding secondary to spontaneous abortion, confirmed by expected decrease in beta hCG, relative to last value obtained on 04/24/2014.  She is hemodynamically stable, and does not show evidence for retained products of conception on clinical exam.  She has a gynecologist available for follow-up care.   Nursing Notes Reviewed/ Care Coordinated Applicable Imaging Reviewed Interpretation of Laboratory Data incorporated into ED treatment  The patient appears reasonably screened and/or stabilized for discharge and I doubt any other medical condition or other [facility] requiring further screening, evaluation, or treatment in the ED at this time prior to discharge.  Plan: Home Medications- Advil, Tylenol; Home Treatments- rest, fluids; return here if the recommended treatment, does not improve the symptoms; Recommended follow up- GYN 1-2 week    Flint MelterElliott L Kishaun Erekson, MD 05/04/14 1104

## 2014-05-04 NOTE — Discharge Instructions (Signed)
Miscarriage A miscarriage is the sudden loss of an unborn baby (fetus) before the 20th week of pregnancy. Most miscarriages happen in the first 3 months of pregnancy. Sometimes, it happens before a woman even knows she is pregnant. A miscarriage is also called a "spontaneous miscarriage" or "early pregnancy loss." Having a miscarriage can be an emotional experience. Talk with your caregiver about any questions you may have about miscarrying, the grieving process, and your future pregnancy plans. CAUSES   Problems with the fetal chromosomes that make it impossible for the baby to develop normally. Problems with the baby's genes or chromosomes are most often the result of errors that occur, by chance, as the embryo divides and grows. The problems are not inherited from the parents.  Infection of the cervix or uterus.   Hormone problems.   Problems with the cervix, such as having an incompetent cervix. This is when the tissue in the cervix is not strong enough to hold the pregnancy.   Problems with the uterus, such as an abnormally shaped uterus, uterine fibroids, or congenital abnormalities.   Certain medical conditions.   Smoking, drinking alcohol, or taking illegal drugs.   Trauma.  Often, the cause of a miscarriage is unknown.  SYMPTOMS   Vaginal bleeding or spotting, with or without cramps or pain.  Pain or cramping in the abdomen or lower back.  Passing fluid, tissue, or blood clots from the vagina. DIAGNOSIS  Your caregiver will perform a physical exam. You may also have an ultrasound to confirm the miscarriage. Blood or urine tests may also be ordered. TREATMENT   Sometimes, treatment is not necessary if you naturally pass all the fetal tissue that was in the uterus. If some of the fetus or placenta remains in the body (incomplete miscarriage), tissue left behind may become infected and must be removed. Usually, a dilation and curettage (D and C) procedure is performed.  During a D and C procedure, the cervix is widened (dilated) and any remaining fetal or placental tissue is gently removed from the uterus.  Antibiotic medicines are prescribed if there is an infection. Other medicines may be given to reduce the size of the uterus (contract) if there is a lot of bleeding.  If you have Rh negative blood and your baby was Rh positive, you will need a Rh immunoglobulin shot. This shot will protect any future baby from having Rh blood problems in future pregnancies. HOME CARE INSTRUCTIONS   Your caregiver may order bed rest or may allow you to continue light activity. Resume activity as directed by your caregiver.  Have someone help with home and family responsibilities during this time.   Keep track of the number of sanitary pads you use each day and how soaked (saturated) they are. Write down this information.   Do not use tampons. Do not douche or have sexual intercourse until approved by your caregiver.   Only take over-the-counter or prescription medicines for pain or discomfort as directed by your caregiver.   Do not take aspirin. Aspirin can cause bleeding.   Keep all follow-up appointments with your caregiver.   If you or your partner have problems with grieving, talk to your caregiver or seek counseling to help cope with the pregnancy loss. Allow enough time to grieve before trying to get pregnant again.  SEEK IMMEDIATE MEDICAL CARE IF:   You have severe cramps or pain in your back or abdomen.  You have a fever.  You pass large blood clots (walnut-sized   or larger) ortissue from your vagina. Save any tissue for your caregiver to inspect.   Your bleeding increases.   You have a thick, bad-smelling vaginal discharge.  You become lightheaded, weak, or you faint.   You have chills.  MAKE SURE YOU:  Understand these instructions.  Will watch your condition.  Will get help right away if you are not doing well or get  worse. Document Released: 11/19/2000 Document Revised: 09/20/2012 Document Reviewed: 07/15/2011 Exit[employer] Patient Information 2015 [employer]. This information is not intended to replace advice given to you by your health care provider. Make sure you discuss any questions you have with your health care provider.  

## 2014-05-10 ENCOUNTER — Encounter: Payer: Self-pay | Admitting: Certified Nurse Midwife

## 2014-05-10 ENCOUNTER — Ambulatory Visit (INDEPENDENT_AMBULATORY_CARE_PROVIDER_SITE_OTHER): Payer: BC Managed Care – PPO | Admitting: Certified Nurse Midwife

## 2014-05-10 VITALS — BP 100/64 | HR 64 | Resp 16 | Ht 63.0 in | Wt 140.0 lb

## 2014-05-10 DIAGNOSIS — D649 Anemia, unspecified: Secondary | ICD-10-CM

## 2014-05-10 DIAGNOSIS — Z0000 Encounter for general adult medical examination without abnormal findings: Secondary | ICD-10-CM

## 2014-05-10 DIAGNOSIS — Z01419 Encounter for gynecological examination (general) (routine) without abnormal findings: Secondary | ICD-10-CM

## 2014-05-10 DIAGNOSIS — O039 Complete or unspecified spontaneous abortion without complication: Secondary | ICD-10-CM

## 2014-05-10 LAB — CBC WITH DIFFERENTIAL/PLATELET
BASOS ABS: 0 10*3/uL (ref 0.0–0.1)
BASOS PCT: 0 % (ref 0–1)
EOS ABS: 0.1 10*3/uL (ref 0.0–0.7)
Eosinophils Relative: 1 % (ref 0–5)
HCT: 31.8 % — ABNORMAL LOW (ref 36.0–46.0)
HEMOGLOBIN: 10.7 g/dL — AB (ref 12.0–15.0)
LYMPHS ABS: 1.9 10*3/uL (ref 0.7–4.0)
Lymphocytes Relative: 37 % (ref 12–46)
MCH: 31.4 pg (ref 26.0–34.0)
MCHC: 33.6 g/dL (ref 30.0–36.0)
MCV: 93.3 fL (ref 78.0–100.0)
MPV: 9.2 fL — ABNORMAL LOW (ref 9.4–12.4)
Monocytes Absolute: 0.5 10*3/uL (ref 0.1–1.0)
Monocytes Relative: 9 % (ref 3–12)
NEUTROS PCT: 53 % (ref 43–77)
Neutro Abs: 2.8 10*3/uL (ref 1.7–7.7)
PLATELETS: 317 10*3/uL (ref 150–400)
RBC: 3.41 MIL/uL — AB (ref 3.87–5.11)
RDW: 13.4 % (ref 11.5–15.5)
WBC: 5.2 10*3/uL (ref 4.0–10.5)

## 2014-05-10 LAB — HEMOGLOBIN, FINGERSTICK: HEMOGLOBIN, FINGERSTICK: 10.8 g/dL — AB (ref 12.0–16.0)

## 2014-05-10 LAB — HCG, QUANTITATIVE, PREGNANCY: hCG, Beta Chain, Quant, S: 16.7 m[IU]/mL

## 2014-05-10 MED ORDER — FUSION PLUS PO CAPS
1.0000 | ORAL_CAPSULE | Freq: Every day | ORAL | Status: DC
Start: 2014-05-10 — End: 2017-02-13

## 2014-05-10 NOTE — Progress Notes (Signed)
28 y.o. [id number] Married African American Fe here for annual exam. Recent spontaneous abortion which occurred on 05/03/14.. Was seen in ER 05/04/14 for heavy bleeding and second HCG was drawn, previous threatened abortion and was seen in MAU and given precautions. HCG was 1955, recent one on 05/04/14 was 208. Patient still having some bleeding today with occasional cramping. Bleeding light now "period like". Denies fever, chills or fever or abdominal pain. Cramping only with last bleeding episode on Monday. Feels well, eating well and drinking fluids. Emotionally OK. Was not planning pregnancy.  Patient's last menstrual period was 01/20/2014 (exact date).          Sexually active: Yes.    The current method of family planning is none.    Exercising: Yes.    cardio,lifting Smoker:  no  Health Maintenance: Pap:  12-09-11 neg MMG:  none Colonoscopy:  none BMD:   none TDaP:  2011 Labs: Hgb. 10.8 Self breast exam: not done   reports that she has never smoked. She does not have any smokeless tobacco history on file. She reports that she does not drink alcohol or use illicit drugs.  Past Medical History  Diagnosis Date  . Chlamydia contact, treated 2010    Past Surgical History  Procedure Laterality Date  . Wisdom tooth extraction      Current Outpatient Prescriptions  Medication Sig Dispense Refill  . Prenatal Vit-Fe Fumarate-FA (PRENATAL MULTIVITAMIN) TABS tablet Take 1 tablet by mouth daily at 12 noon.     No current facility-administered medications for this visit.    Family History  Problem Relation Age of Onset  . Hypertension Mother   . Hypertension Maternal Grandmother   . Heart disease Maternal Grandmother   . Heart disease Maternal Grandfather   . Depression Mother     ROS:  Pertinent items are noted in HPI.  Otherwise, a comprehensive ROS was negative.  Exam:   BP 100/64 mmHg  Pulse 64  Resp 16  Ht 5\' 3"  (1.6 m)  Wt 140 lb (63.504 kg)  BMI 24.81 kg/m2  LMP  01/20/2014 (Exact Date) Height: 5\' 3"  (160 cm)  Ht Readings from Last 3 Encounters:  05/10/14 5\' 3"  (1.6 m)  03/18/13 5' 3.25" (1.607 m)  12/17/12 5' 3.25" (1.607 m)    General appearance: alert, cooperative and appears stated age Head: Normocephalic, without obvious abnormality, atraumatic Neck: no adenopathy, supple, symmetrical, trachea midline and thyroid normal to inspection and palpation and non-palpable Lungs: clear to auscultation bilaterally Breasts: normal appearance, no masses or tenderness, No nipple retraction or dimpling, No nipple discharge or bleeding, No axillary or supraclavicular adenopathy Heart: regular rate and rhythm Abdomen: soft, non-tender; no masses,  no organomegaly Extremities: extremities normal, atraumatic, no cyanosis or edema Skin: Skin color, texture, turgor normal. No rashes or lesions Lymph nodes: Cervical, supraclavicular, and axillary nodes normal. No abnormal inguinal nodes palpated Neurologic: Grossly normal   Pelvic: External genitalia:  no lesions              Urethra:  normal appearing urethra with no masses, tenderness or lesions              Bartholin's and Skene's: normal                 Vagina: normal appearing vagina with normal color and small amount dark blood noted, no odor              Cervix: parous, large, slightly open, non tender blood noted from  cervix, no odor              Pap taken: No. Bimanual Exam:  Uterus:  enlarged, 10-12 weeks size non tender              Adnexa: normal adnexa and no mass, fullness, tenderness               Rectovaginal: Confirms               Anus:  normal appearance  A:  Well Woman with normal exam   Contraception OCP desired  Recent SAB with bleeding and involution still occurring ( see epic MAU and ER notes and US report)  Anemia  P:   Reviewed health and wellness pertinent to exam  Discussed can not start OCP until HCG levels are normal and uterine exam is normal. Discussed no sexual activity  for 4-6 weeks until bleeding has stopped, patient aware and does not plan to be sexually active until on contraception  Discussed findings of uterus and still resolving as hormonal level decreases. Discussed bleeding expectations and warning signs with bleeding and infection. Will need to advise if occurs. Voiced understanding. Patient will need another HCG quant. In one week. Patient to schedule.Also discussed PUS after levels are normal due to irregular appearance per US report. ( consulted with Dr. Edward JollySilva agreeable with plan)  Questions addressed. Discussed it is not unusual feel a sense of loss due to SAB and grieving is normal. Encouraged partner support.  Discussed increase of iron in diet and start on oral iron supplement.  Rx Fusion plus see order  Pap smear not taken  Today   counseled on breast self exam, STD prevention, HIV risk factors and prevention, use and side effects of OCP's, adequate intake of calcium and vitamin D, diet and exercise  return annually or prn  15 minutes spent with patient  in face to face counseling regarding SAB, bleeding, follow up and grieving the pregnancy...    Addendum of Stat lab HCG 16.7

## 2014-05-10 NOTE — Patient Instructions (Signed)
Miscarriage A miscarriage is the loss of an unborn baby (fetus) before the 20th week of pregnancy. The cause is often unknown.  HOME CARE  You may need to stay in bed (bed rest), or you may be able to do light activity. Go about activity as told by your doctor.  Have help at home.  Write down how many pads you use each day. Write down how soaked they are.  Do not use tampons. Do not wash out your vagina (douche) or have sex (intercourse) until your doctor approves.  Only take medicine as told by your doctor.  Do not take aspirin.  Keep all doctor visits as told.  If you or your partner have problems with grieving, talk to your doctor. You can also try counseling. Give yourself time to grieve before trying to get pregnant again. GET HELP RIGHT AWAY IF:  You have bad cramps or pain in your back or belly (abdomen).  You have a fever.  You pass large clumps of blood (clots) from your vagina that are walnut-sized or larger. Save the clumps for your doctor to see.  You pass large amounts of tissue from your vagina. Save the tissue for your doctor to see.  You have more bleeding.  You have thick, bad-smelling fluid (discharge) coming from the vagina.  You get lightheaded, weak, or you pass out (faint).  You have chills. MAKE SURE YOU:  Understand these instructions.  Will watch your condition.  Will get help right away if you are not doing well or get worse. Document Released: 08/18/2011 Document Reviewed: 08/18/2011 [facility] Patient Information 2015 [place]. This information is not intended to replace advice given to you by your health care provider. Make sure you discuss any questions you have with your health care provider.        General topics  Next pap or exam is  due in 1 year Take a Women's multivitamin Take 1200 mg. of calcium daily - prefer dietary If any concerns in interim to call back  Breast Self-Awareness Practicing breast self-awareness may pick  up problems early, prevent significant medical complications, and possibly save your life. By practicing breast self-awareness, you can become familiar with how your breasts look and feel and if your breasts are changing. This allows you to notice changes early. It can also offer you some reassurance that your breast health is good. One way to learn what is normal for your breasts and whether your breasts are changing is to do a breast self-exam. If you find a lump or something that was not present in the past, it is best to contact your caregiver right away. Other findings that should be evaluated by your caregiver include nipple discharge, especially if it is bloody; skin changes or reddening; areas where the skin seems to be pulled in (retracted); or new lumps and bumps. Breast pain is seldom associated with cancer (malignancy), but should also be evaluated by a caregiver. BREAST SELF-EXAM The best time to examine your breasts is 5 7 days after your menstrual period is over.  [employer] Patient Information 2013 [place].   Exercise to Stay Healthy Exercise helps you become and stay healthy. EXERCISE IDEAS AND TIPS Choose exercises that:  You enjoy.  Fit into your day. You do not need to exercise really hard to be healthy. You can do exercises at a slow or medium level and stay healthy. You can:  Stretch before and after working out.  Try yoga, Pilates, or tai  chi.  Lift weights.  Walk fast, swim, jog, run, climb stairs, bicycle, dance, or rollerskate.  Take aerobic classes. Exercises that burn about 150 calories:  Running 1  miles in 15 minutes.  Playing volleyball for 45 to 60 minutes.  Washing and waxing a car for 45 to 60 minutes.  Playing touch football for 45 minutes.  Walking 1  miles in 35 minutes.  Pushing a stroller 1  miles in 30 minutes.  Playing basketball for 30 minutes.  Raking leaves for 30 minutes.  Bicycling 5 miles in 30 minutes.  Walking 2  miles in 30 minutes.  Dancing for 30 minutes.  Shoveling snow for 15 minutes.  Swimming laps for 20 minutes.  Walking up stairs for 15 minutes.  Bicycling 4 miles in 15 minutes.  Gardening for 30 to 45 minutes.  Jumping rope for 15 minutes.  Washing windows or floors for 45 to 60 minutes. Document Released: 06/28/2010 Document Revised: 08/18/2011 Document Reviewed: 06/28/2010 [facility] Patient Information 2013 [place].   Other topics ( that may be useful information):    Sexually Transmitted Disease Sexually transmitted disease (STD) refers to any infection that is passed from person to person during sexual activity. This may happen by way of saliva, semen, blood, vaginal mucus, or urine. Common STDs include:  Gonorrhea.  Chlamydia.  Syphilis.  HIV/AIDS.  Genital herpes.  Hepatitis B and C.  Trichomonas.  Human papillomavirus (HPV).  Pubic lice. CAUSES  An STD may be spread by bacteria, virus, or parasite. A person can get an STD by:  Sexual intercourse with an infected person.  Sharing sex toys with an infected person.  Sharing needles with an infected person.  Having intimate contact with the genitals, mouth, or rectal areas of an infected person. SYMPTOMS  Some people may not have any symptoms, but they can still pass the infection to others. Different STDs have different symptoms. Symptoms include:  Painful or bloody urination.  Pain in the pelvis, abdomen, vagina, anus, throat, or eyes.  Skin rash, itching, irritation, growths, or sores (lesions). These usually occur in the genital or anal area.  Abnormal vaginal discharge.  Penile discharge in men.  Soft, flesh-colored skin growths in the genital or anal area.  Fever.  Pain or bleeding during sexual intercourse.  Swollen glands in the groin area.  Yellow skin and eyes (jaundice). This is seen with hepatitis. DIAGNOSIS  To make a diagnosis, your caregiver may:  Take a medical  history.  Perform a physical exam.  Take a specimen (culture) to be examined.  Examine a sample of discharge under a microscope.  Perform blood test TREATMENT   Chlamydia, gonorrhea, trichomonas, and syphilis can be cured with antibiotic medicine.  Genital herpes, hepatitis, and HIV can be treated, but not cured, with prescribed medicines. The medicines will lessen the symptoms.  Genital warts from HPV can be treated with medicine or by freezing, burning (electrocautery), or surgery. Warts may come back.  HPV is a virus and cannot be cured with medicine or surgery.However, abnormal areas may be followed very closely by your caregiver and may be removed from the cervix, vagina, or vulva through office procedures or surgery. If your diagnosis is confirmed, your recent sexual partners need treatment. This is true even if they are symptom-free or have a negative culture or evaluation. They should not have sex until their caregiver says it is okay. HOME CARE INSTRUCTIONS  All sexual partners should be informed, tested, and treated for all STDs.  Take your antibiotics as directed. Finish them even if you start to feel better.  Only take over-the-counter or prescription medicines for pain, discomfort, or fever as directed by your caregiver.  Rest.  Eat a balanced diet and drink enough fluids to keep your urine clear or pale yellow.  Do not have sex until treatment is completed and you have followed up with your caregiver. STDs should be checked after treatment.  Keep all follow-up appointments, Pap tests, and blood tests as directed by your caregiver.  Only use latex condoms and water-soluble lubricants during sexual activity. Do not use petroleum jelly or oils.  Avoid alcohol and illegal drugs.  Get vaccinated for HPV and hepatitis. If you have not received these vaccines in the past, talk to your caregiver about whether one or both might be right for you.  Avoid risky sex  practices that can break the skin. The only way to avoid getting an STD is to avoid all sexual activity.Latex condoms and dental dams (for oral sex) will help lessen the risk of getting an STD, but will not completely eliminate the risk. SEEK MEDICAL CARE IF:   You have a fever.  You have any new or worsening symptoms. Document Released: 08/16/2002 Document Revised: 08/18/2011 Document Reviewed: 08/23/2010 [facility] Patient Information 2013 [place].    Domestic Abuse You are being battered or abused if someone close to you hits, pushes, or physically hurts you in any way. You also are being abused if you are forced into activities. You are being sexually abused if you are forced to have sexual contact of any kind. You are being emotionally abused if you are made to feel worthless or if you are constantly threatened. It is important to remember that help is available. No one has the right to abuse you. PREVENTION OF FURTHER ABUSE  Learn the warning signs of danger. This varies with situations but may include: the use of alcohol, threats, isolation from friends and family, or forced sexual contact. Leave if you feel that violence is going to occur.  If you are attacked or beaten, report it to the police so the abuse is documented. You do not have to press charges. The police can protect you while you or the attackers are leaving. Get the officer's name and badge number and a copy of the report.  Find someone you can trust and tell them what is happening to you: your caregiver, a nurse, clergy member, close friend or family member. Feeling ashamed is natural, but remember that you have done nothing wrong. No one deserves abuse. Document Released: 05/23/2000 Document Revised: 08/18/2011 Document Reviewed: 08/01/2010 [facility] Patient Information 2013 [place].    How Much is Too Much Alcohol? Drinking too much alcohol can cause injury, accidents, and health problems. These types  of problems can include:   Car crashes.  Falls.  Family fighting (domestic violence).  Drowning.  Fights.  Injuries.  Burns.  Damage to certain organs.  Having a baby with birth defects. ONE DRINK CAN BE TOO MUCH WHEN YOU ARE:  Working.  Pregnant or breastfeeding.  Taking medicines. Ask your doctor.  Driving or planning to drive. If you or someone you know has a drinking problem, get help from a doctor.  Document Released: 03/22/2009 Document Revised: 08/18/2011 Document Reviewed: 03/22/2009 [facility] Patient Information 2013 [place].   Smoking Hazards Smoking cigarettes is extremely bad for your health. Tobacco smoke has over 200 known poisons in it. There are over 60 chemicals  in tobacco smoke that cause cancer. Some of the chemicals found in cigarette smoke include:   Cyanide.  Benzene.  Formaldehyde.  Methanol (wood alcohol).  Acetylene (fuel used in welding torches).  Ammonia. Cigarette smoke also contains the poisonous gases nitrogen oxide and carbon monoxide.  Cigarette smokers have an increased risk of many serious medical problems and Smoking causes approximately:  90% of all lung cancer deaths in men.  80% of all lung cancer deaths in women.  90% of deaths from chronic obstructive lung disease. Compared with nonsmokers, smoking increases the risk of:  Coronary heart disease by 2 to 4 times.  Stroke by 2 to 4 times.  Men developing lung cancer by 23 times.  Women developing lung cancer by 13 times.  Dying from chronic obstructive lung diseases by 12 times.  . Smoking is the most preventable cause of death and disease in our society.  WHY IS SMOKING ADDICTIVE?  Nicotine is the chemical agent in tobacco that is capable of causing addiction or dependence.  When you smoke and inhale, nicotine is absorbed rapidly into the bloodstream through your lungs. Nicotine absorbed through the lungs is capable of creating a powerful addiction.  Both inhaled and non-inhaled nicotine may be addictive.  Addiction studies of cigarettes and spit tobacco show that addiction to nicotine occurs mainly during the teen years, when young people begin using tobacco products. WHAT ARE THE BENEFITS OF QUITTING?  There are many health benefits to quitting smoking.   Likelihood of developing cancer and heart disease decreases. Health improvements are seen almost immediately.  Blood pressure, pulse rate, and breathing patterns start returning to normal soon after quitting. QUITTING SMOKING   American Lung Association - 1-800-LUNGUSA  American Cancer Society - 1-800-ACS-2345 Document Released: 07/03/2004 Document Revised: 08/18/2011 Document Reviewed: 03/07/2009 [facility] Patient Information 2013 [place].   Stress Management Stress is a state of physical or mental tension that often results from changes in your life or normal routine. Some common causes of stress are:  Death of a loved one.  Injuries or severe illnesses.  Getting fired or changing jobs.  Moving into a new home. Other causes may be:  Sexual problems.  Business or financial losses.  Taking on a large debt.  Regular conflict with someone at home or at work.  Constant tiredness from lack of sleep. It is not just bad things that are stressful. It may be stressful to:  Win the lottery.  Get married.  Buy a new car. The amount of stress that can be easily tolerated varies from person to person. Changes generally cause stress, regardless of the types of change. Too much stress can affect your health. It may lead to physical or emotional problems. Too little stress (boredom) may also become stressful. SUGGESTIONS TO REDUCE STRESS:  Talk things over with your family and friends. It often is helpful to share your concerns and worries. If you feel your problem is serious, you may want to get help from a professional counselor.  Consider your problems one at a  time instead of lumping them all together. Trying to take care of everything at once may seem impossible. List all the things you need to do and then start with the most important one. Set a goal to accomplish 2 or 3 things each day. If you expect to do too many in a single day you will naturally fail, causing you to feel even more stressed.  Do not use alcohol or drugs to relieve  stress. Although you may feel better for a short time, they do not remove the problems that caused the stress. They can also be habit forming.  Exercise regularly - at least 3 times per week. Physical exercise can help to relieve that "uptight" feeling and will relax you.  The shortest distance between despair and hope is often a good night's sleep.  Go to bed and get up on time allowing yourself time for appointments without being rushed.  Take a short "time-out" period from any stressful situation that occurs during the day. Close your eyes and take some deep breaths. Starting with the muscles in your face, tense them, hold it for a few seconds, then relax. Repeat this with the muscles in your neck, shoulders, hand, stomach, back and legs.  Take good care of yourself. Eat a balanced diet and get plenty of rest.  Schedule time for having fun. Take a break from your daily routine to relax. HOME CARE INSTRUCTIONS   Call if you feel overwhelmed by your problems and feel you can no longer manage them on your own.  Return immediately if you feel like hurting yourself or someone else. Document Released: 11/19/2000 Document Revised: 08/18/2011 Document Reviewed: 07/12/2007 [facility] Patient Information 2013 [place].

## 2014-05-12 ENCOUNTER — Telehealth: Payer: Self-pay | Admitting: Certified Nurse Midwife

## 2014-05-12 NOTE — Telephone Encounter (Signed)
Pt requesting lab results. States she was expecting a call yesterday.

## 2014-05-12 NOTE — Telephone Encounter (Signed)
Spoke with patient. Advised patient spoke with [name] CNM. Advised Hcg is now 16.7 and is dropping appropriately. Needs to keep follow up lab appointment for 12/9. Patient is agreeable and verbalizes understanding. States that bleeding has greatly decreased.  [name] CNM agree to plan?

## 2014-05-12 NOTE — Telephone Encounter (Signed)
[name] CNM please review and advise labs from 12/02.

## 2014-05-14 NOTE — Progress Notes (Signed)
Encounter reviewed by Dr. Conley SimmondsBrook Silva. Case discussed with me.  Repeat hCG in one week and ultrasound after hCG negative or if it is persistently positive.

## 2014-05-15 NOTE — Telephone Encounter (Signed)
yes

## 2014-05-17 ENCOUNTER — Other Ambulatory Visit (INDEPENDENT_AMBULATORY_CARE_PROVIDER_SITE_OTHER): Payer: BC Managed Care – PPO

## 2014-05-17 DIAGNOSIS — O039 Complete or unspecified spontaneous abortion without complication: Secondary | ICD-10-CM

## 2014-05-18 ENCOUNTER — Telehealth: Payer: Self-pay

## 2014-05-18 DIAGNOSIS — Z8759 Personal history of other complications of pregnancy, childbirth and the puerperium: Secondary | ICD-10-CM

## 2014-05-18 LAB — HCG, QUANTITATIVE, PREGNANCY: hCG, Beta Chain, Quant, S: 3.9 m[IU]/mL

## 2014-05-18 NOTE — Telephone Encounter (Signed)
-----   Message from Verner Choleborah S Leonard, CNM sent at 05/18/2014  8:07 AM EST ----- Notify patient that her HCG is now to less than 5 ( 3.9) which is normal.  We now need to schedule PUS per Dr. Edward JollySilva recommendation due [facility] Order in

## 2014-05-18 NOTE — Telephone Encounter (Signed)
Attempted to reach patient at number provided [phone]. No answer. Recording states "The person you have reached is not available at this time. Please try again later."

## 2014-05-18 NOTE — Telephone Encounter (Signed)
Spoke with patient. Advised patient of message as seen below from [name] CNM. Patient is agreeable and verbalizes understanding. Requesting morning PUS the week of Jan 4th. Appointment scheduled for 1/7 at 9am with 9:30am consult with Dr.Silva. Patient is agreeable to date and time. Order will need precert.  Cc: [name] for precert.  Routing to provider for final review. Patient agreeable to disposition. Will close encounter

## 2014-05-18 NOTE — Telephone Encounter (Signed)
Pr $15

## 2014-05-19 NOTE — Progress Notes (Signed)
Reviewed personally.  M. Suzanne Colin Norment, MD.  

## 2014-06-15 ENCOUNTER — Telehealth: Payer: Self-pay | Admitting: Obstetrics and Gynecology

## 2014-06-15 ENCOUNTER — Ambulatory Visit (INDEPENDENT_AMBULATORY_CARE_PROVIDER_SITE_OTHER): Payer: BLUE CROSS/BLUE SHIELD

## 2014-06-15 ENCOUNTER — Encounter: Payer: Self-pay | Admitting: Obstetrics and Gynecology

## 2014-06-15 ENCOUNTER — Ambulatory Visit (INDEPENDENT_AMBULATORY_CARE_PROVIDER_SITE_OTHER): Payer: BLUE CROSS/BLUE SHIELD | Admitting: Obstetrics and Gynecology

## 2014-06-15 VITALS — BP 112/60 | HR 80 | Ht 63.0 in | Wt 140.0 lb

## 2014-06-15 DIAGNOSIS — O039 Complete or unspecified spontaneous abortion without complication: Secondary | ICD-10-CM

## 2014-06-15 DIAGNOSIS — R938 Abnormal findings on diagnostic imaging of other specified body structures: Secondary | ICD-10-CM

## 2014-06-15 DIAGNOSIS — Z8759 Personal history of other complications of pregnancy, childbirth and the puerperium: Secondary | ICD-10-CM

## 2014-06-15 DIAGNOSIS — Z8742 Personal history of other diseases of the female genital tract: Secondary | ICD-10-CM

## 2014-06-15 DIAGNOSIS — R9389 Abnormal findings on diagnostic imaging of other specified body structures: Secondary | ICD-10-CM

## 2014-06-15 NOTE — Telephone Encounter (Signed)
Patient due to return in two weeks for follow up ultrasound. Dr.Silva would you like me to schedule ultrasound with Dr.Miller as next ultrasound will be with your return on 2/4.

## 2014-06-15 NOTE — Progress Notes (Signed)
Subjective  Patient is here today for ultrasound following SAB.  Had heavy bleeding on 05/03/14.  Prior to 11/25 was bleeding some for about 2 weeks.   Has had serial HCGs: 11/16 - 1955 11/26 - 208 12/2 - 16.7 12/9 - 3.9  Prior pelvic ultrasound on 04/24/14 at hospital.  CLINICAL DATA: Vaginal bleeding  EXAM: OBSTETRIC <14 WK US AND TRANSVAGINAL OB US  TECHNIQUE: Both transabdominal and transvaginal ultrasound examinations were performed for complete evaluation of the gestation as well as the maternal uterus, adnexal regions, and pelvic cul-de-sac. Transvaginal technique was performed to assess early pregnancy.  COMPARISON: None.  FINDINGS: Intrauterine gestational sac: An irregular area of decreased echogenicity is noted which may represent a gestational sac although it does not represent a normal appearing gestational sac. Given the patient's last menstrual period, gestational sac and fetal pole should be identified.  Maternal uterus/adnexae: Significant increased vascularity is noted surrounding the irregular hypoechoic structures in the endometrial canal. The ovaries are within normal limits bilaterally.  IMPRESSION: Irregular area of decreased echogenicity in the endometrial canal with significant increased vascularity. No definitive gestational sac is seen. Given the patient's current hemorrhage, this likely represents a miscarriage in progress. Correlation with the patient's pending beta HCG level is recommended. Followup examination could be performed as clinically indicated.   Electronically Signed  By: Alcide CleverMark Lukens M.D.  On: 04/24/2014 20:52  Patient states spotting on 12/28 or 12/29 which lasted about 2 days.  No pain or cramping.   Having unprotected intercourse during this time.  Last time about 12/31 or 1/1.  Not trying for pregnancy.   Objective  Pelvic ultrasound today - images and report reviewed with patient.   Uterus with  echogenic fluid filled area.  No embryo. Avascular area.  Ovaries normal.  No free fluid.      Assessment  Status post SAB.  Echogenic area in endometrial canal. Unprotected intercourse.   Plan  Quant HCG now.  Anticipated pelvic ultrasound in 2 weeks.  No unprotected intercourse.   25 minutes face to face time of which over 50% was spent in counseling.   After visit summary to patient.

## 2014-06-15 NOTE — Telephone Encounter (Signed)
Patient was seen today and thinks she is supposed to return in two weeks?

## 2014-06-16 LAB — HCG, QUANTITATIVE, PREGNANCY: hCG, Beta Chain, Quant, S: <2 m[IU]/mL

## 2014-06-16 NOTE — Telephone Encounter (Signed)
Phone call to discuss negative beta HCG result from yesterday and discuss doing ultrasound with Dr. Hyacinth MeekerMiller or having at the [facility] in two weeks.  Unable to discuss with patient or leave a message as "wireless customer is unavailable." No ability to leave a message for the patient.

## 2014-06-19 NOTE — Telephone Encounter (Signed)
-----   Message from Brook E Amundson de Gwenevere Ghaziarvalho E Silva, MD sent at 06/17/2014 1Royer1:46 AM EST ----- Please inform patient of negative pregnancy test. I was unable to reach the patient by phone.   She will need an ultrasound the week of 1/18 which can be done at the [facility] prior to 1/20 or can be done with Dr. Hyacinth MeekerMiller on 1/21 in my absence.   If Dr. Hyacinth MeekerMiller does the ultrasound, please let me know as I will need to discuss this case with her.

## 2014-06-19 NOTE — Telephone Encounter (Signed)
Attempted to reach patient at number provided [phone]. There was no answer and no ability to leave voicemail as recording states "The wireless customer you are trying to reach is unavailable."

## 2014-06-19 NOTE — Telephone Encounter (Signed)
Spoke with patient. Advised patient of message and results as seen below from Dr.Silva. Patient would like to schedule ultrasound here in the office with Dr.Miller at this time. Appointment scheduled for 1/21 at 2:30pm with 3pm consult with Dr.Miller. Agreeable to date and time.   Cc: Dr.Miller  Routing to provider for final review. Patient agreeable to disposition. Will close encounter

## 2014-06-29 ENCOUNTER — Other Ambulatory Visit: Payer: BLUE CROSS/BLUE SHIELD

## 2014-06-29 ENCOUNTER — Telehealth: Payer: Self-pay | Admitting: Obstetrics & Gynecology

## 2014-06-29 ENCOUNTER — Other Ambulatory Visit: Payer: BLUE CROSS/BLUE SHIELD | Admitting: Obstetrics & Gynecology

## 2014-06-29 NOTE — Telephone Encounter (Signed)
Patient (patient canceled appointment @ 1:45pm today she just got a call from her daughter's daycare she is sick/rd

## 2014-06-29 NOTE — Telephone Encounter (Signed)
Please call patient to schedule ultrasound appointment she had to cancel due to her daughter being sick.

## 2014-07-03 NOTE — Telephone Encounter (Signed)
Thank you. Encounter closed. 

## 2014-07-04 NOTE — Telephone Encounter (Signed)
Call to patient to discuss rescheduling PUS. No answer. No voicemail available.

## 2014-07-11 NOTE — Telephone Encounter (Signed)
Call to patient to reschedule PUS. No answer. No voicemail

## 2014-07-25 NOTE — Telephone Encounter (Signed)
Call to patient to discuss rescheduling PUS. No answer. No voicemail available.

## 2014-08-03 ENCOUNTER — Telehealth: Payer: Self-pay | Admitting: Obstetrics & Gynecology

## 2014-08-03 NOTE — Telephone Encounter (Signed)
Patient is ready to reschedule her PUS appointment. 

## 2014-08-03 NOTE — Telephone Encounter (Signed)
Call to patient. No answer. No voicemail available.

## 2014-08-08 ENCOUNTER — Telehealth: Payer: Self-pay | Admitting: Obstetrics & Gynecology

## 2014-08-08 NOTE — Telephone Encounter (Signed)
Spoke with patient. Patient would like to reschedule ultrasound at this time. Patient was last seen 06/15/2014 with Dr.Silva for ultrasound and was recommended to have follow up ultrasound in 2 weeks. Patient was unable to keep follow up appointment and would like to schedule at this time. Appointment scheduled for 3/10 at 11am with 11:30am consult with Dr.Silva. Patient is agreeable to date and time.  Routing to provider for final review. Patient agreeable to disposition. Will close encounter

## 2014-08-08 NOTE — Telephone Encounter (Signed)
Pt calling to reschedule ultrasound. Pt cancelled appointment in January.

## 2014-08-17 ENCOUNTER — Other Ambulatory Visit: Payer: BLUE CROSS/BLUE SHIELD

## 2014-08-17 ENCOUNTER — Other Ambulatory Visit: Payer: BLUE CROSS/BLUE SHIELD | Admitting: Obstetrics and Gynecology

## 2014-08-17 ENCOUNTER — Telehealth: Payer: Self-pay | Admitting: Obstetrics and Gynecology

## 2014-08-17 ENCOUNTER — Encounter: Payer: Self-pay | Admitting: Obstetrics and Gynecology

## 2014-08-17 NOTE — Telephone Encounter (Signed)
Patient dnka PUS/Consult appointment today. Dnka fee applied, letter prepared and message sent to Dr.Silva. Please call to reschedule.

## 2014-08-17 NOTE — Telephone Encounter (Signed)
DNKA protocol applied. Routing to [place] for rescheduling.

## 2014-08-17 NOTE — Telephone Encounter (Signed)
Patient called and stated she will not make it to her appointment for PUS today. When questioned why she will not keep the appointment she said, "I lost track of time."

## 2014-08-17 NOTE — Telephone Encounter (Signed)
DNKA protocol applies.

## 2014-08-18 ENCOUNTER — Encounter: Payer: Self-pay | Admitting: Obstetrics and Gynecology

## 2014-08-23 NOTE — Telephone Encounter (Signed)
Call to patient. Could not leave voicemail message. Will try again later

## 2014-08-30 NOTE — Telephone Encounter (Signed)
Call to patient in an attempt to reschedule PUS. Received the following message: °"The wireless customer you are trying to reach is not available. Please try again later." °

## 2014-09-13 NOTE — Telephone Encounter (Signed)
Call to patient in an attempt to reschedule PUS. Received the following message: "The wireless customer you are trying to reach is not available. Please try again later."

## 2014-09-22 ENCOUNTER — Telehealth: Payer: Self-pay | Admitting: *Deleted

## 2014-09-22 NOTE — Telephone Encounter (Signed)
Follow up call to patient regarding pelvic ultrasound to be scheduled. Left message to call back.

## 2014-09-25 NOTE — Telephone Encounter (Signed)
Patient returned call. Advised calling to follow-up on scheduling pelvic ultrasound recommended by Dr Edward JollySilva to re-evaluate endometrial lining. Patient agreeable to schedule. States she has had cycles the last two months, next one due 10-15-14. Scheduled PUS for 10-19-14 at 1130. Patient is instructed to call back if menses does not start on schedule.Patient agreeable.  Routing to provider for final review. Patient agreeable to disposition. Will close encounter

## 2014-10-16 ENCOUNTER — Telehealth: Payer: Self-pay | Admitting: Obstetrics and Gynecology

## 2014-10-16 NOTE — Telephone Encounter (Signed)
Left message to call Kaitlyn at [phone]. 

## 2014-10-16 NOTE — Telephone Encounter (Signed)
Pt would like to reschedule ultrasound for this Thursday to a later time on same day.

## 2014-10-17 NOTE — Telephone Encounter (Signed)
Spoke with patient. Advised patient her 11:30 am ultrasound with 12pm consult with Dr.Silva is the latest slot Dr.Silva has for ultrasounds that day. Patient is agreeable to keep appointment as scheduled. Patient states that she started her cycle today and is having light spotting. Advised okay to keep appointment as schedule as PUS can be performed on cycle. Advised if bleeding increases or she feel uncomfortable on Thursday to give the office a call to discuss rescheduling if needed. Patient is agreeable. Patient has some billing questions. Phone call passed to [employer] Harris.  Routing to provider for final review. Patient agreeable to disposition. Patient aware provider will review message and nurse will return call with any additional instructions or change of disposition. Will close encounter.

## 2014-10-19 ENCOUNTER — Other Ambulatory Visit: Payer: BLUE CROSS/BLUE SHIELD | Admitting: Obstetrics and Gynecology

## 2014-10-19 ENCOUNTER — Other Ambulatory Visit: Payer: BLUE CROSS/BLUE SHIELD

## 2014-10-19 ENCOUNTER — Telehealth: Payer: Self-pay

## 2014-10-19 NOTE — Telephone Encounter (Signed)
I am concerned about last minute cancellations, failed appointment previously, and lack of proceeding forward with evaluation of unusual changes seen in the endometrium of her prior ultrasound.   If she does not proceed with rescheduled appointment, needs 30 day letter.   [facility]

## 2014-10-19 NOTE — Telephone Encounter (Signed)
Spoke with patient at time of incoming call. Patient states that her cycle has gotten heavier over night and she would like to reschedule her appointment. Advised PUS can be performed while on cycle but patient does not feel comfortable. Appointment rescheduled for 5/26 at 11:30am with 12pm consult with Dr.Silva. Patient is agreeable to date and time.  Dr.Silva please review for late cancellation fee or waiving. Thank you.

## 2014-11-02 ENCOUNTER — Ambulatory Visit (INDEPENDENT_AMBULATORY_CARE_PROVIDER_SITE_OTHER): Payer: BLUE CROSS/BLUE SHIELD | Admitting: Obstetrics and Gynecology

## 2014-11-02 ENCOUNTER — Ambulatory Visit (INDEPENDENT_AMBULATORY_CARE_PROVIDER_SITE_OTHER): Payer: BLUE CROSS/BLUE SHIELD

## 2014-11-02 ENCOUNTER — Encounter: Payer: Self-pay | Admitting: Obstetrics and Gynecology

## 2014-11-02 VITALS — BP 98/58 | HR 64

## 2014-11-02 DIAGNOSIS — R938 Abnormal findings on diagnostic imaging of other specified body structures: Secondary | ICD-10-CM

## 2014-11-02 DIAGNOSIS — O039 Complete or unspecified spontaneous abortion without complication: Secondary | ICD-10-CM

## 2014-11-02 DIAGNOSIS — R9389 Abnormal findings on diagnostic imaging of other specified body structures: Secondary | ICD-10-CM

## 2014-11-02 NOTE — Progress Notes (Signed)
Subjective  29 y.o. [id number]  African American female here for pelvic ultrasound for evaluation of endometrium.  Had SAB and was seen for office pelvic ultrasound 06/15/14 which showed thickened endometrium.  Quant beta HCG 06/15/14 < 2.  Previous hospital ultrasound had commented on unusual appearance of her endometrium and increased vascularity. She delayed in her care until today to return to evaluate the endometrium despite recommendations for a prompt return.   Menses are monthly.  Last 4 days.  No heavy bleeding or significant cramping.  No bleeding in between menses.   Condoms for birth control.  Considering childbearing in the next two years.   Objective  Pelvic ultrasound images and report reviewed with patient.  Uterus - no masses. EMS - 9.05 mm. Ovaries - normal.  Left CL cyst.  Free fluid - yes, mild amount.     Assessment  Previous abnormal ultrasound finding.  Normal pelvic ultrasound.  History of SAB.   Plan  Explanation for reasoning behind evaluation of the endometrium based on the unusual verbage used in the hospital ultrasound.  Discussed concerns for retained products of conception and gestational trophoblasic disease as etiologies for the findings previously seen.  No evidence of gestational trophoblastic disease. May return to routine care with annual exams.   ____15___ minutes face to face time of which over 50% was spent in counseling.   After visit summary to patient.

## 2014-12-21 ENCOUNTER — Ambulatory Visit (INDEPENDENT_AMBULATORY_CARE_PROVIDER_SITE_OTHER): Payer: BLUE CROSS/BLUE SHIELD

## 2014-12-21 ENCOUNTER — Ambulatory Visit (INDEPENDENT_AMBULATORY_CARE_PROVIDER_SITE_OTHER): Payer: BLUE CROSS/BLUE SHIELD | Admitting: Obstetrics and Gynecology

## 2014-12-21 ENCOUNTER — Encounter: Payer: Self-pay | Admitting: Obstetrics and Gynecology

## 2014-12-21 VITALS — BP 110/60 | HR 80 | Resp 16 | Wt 140.0 lb

## 2014-12-21 DIAGNOSIS — O209 Hemorrhage in early pregnancy, unspecified: Secondary | ICD-10-CM

## 2014-12-21 DIAGNOSIS — Z8619 Personal history of other infectious and parasitic diseases: Secondary | ICD-10-CM

## 2014-12-21 DIAGNOSIS — N926 Irregular menstruation, unspecified: Secondary | ICD-10-CM | POA: Diagnosis not present

## 2014-12-21 DIAGNOSIS — A499 Bacterial infection, unspecified: Secondary | ICD-10-CM

## 2014-12-21 DIAGNOSIS — B9689 Other specified bacterial agents as the cause of diseases classified elsewhere: Secondary | ICD-10-CM

## 2014-12-21 DIAGNOSIS — N760 Acute vaginitis: Secondary | ICD-10-CM

## 2014-12-21 LAB — US OB TRANSVAGINAL

## 2014-12-21 LAB — POCT URINE PREGNANCY: Preg Test, Ur: POSITIVE — AB

## 2014-12-21 MED ORDER — METRONIDAZOLE 500 MG PO TABS: 500.0000 mg | ORAL_TABLET | Freq: Two times a day (BID) | ORAL | Status: DC

## 2014-12-21 NOTE — Patient Instructions (Signed)
Call with fever, worsening abdominal pain, bleeding through greater a pad an hour or any other concerns  You need close monitoring with any future pregnancy, call with +pregnancy test  F/U for blood work on Monday

## 2014-12-21 NOTE — Progress Notes (Signed)
Patient ID: Holly CaoKristin Reeves, female   DOB: [birth year], 29 y.o.   MRN: [id number] GYNECOLOGY  VISIT   HPI: 29 y.o.   Married  [place]   [id number] with Patient's last menstrual period was 10/20/2014.  EGA [redacted] week, 6 days. Positive home pregnancy 3 weeks ago, cycles are every 28 days.  She is here to confirm pregnancy. Patient is concerned because she had a gush of blood yesterday, since then spotting off and on.  She c/o severe cramping the night prior to the bleeding. She had some spotting today. No cramping today. Blood type is O+. H/O chlamydia after having her baby. SAB 11/15. On questioning the patient c/o a vaginal odor for the last 3 weeks, no increased d/c until yesterday with the bleeding.   GYNECOLOGIC HISTORY: Patient's last menstrual period was 10/20/2014. Contraception:None Menopausal hormone therapy: N/A Last mammogram: N/A Last pap smear: 12-09-11 WNL         OB History    Gravida Para Term Preterm AB TAB SAB Ectopic Multiple Living   4 1 1  2 1 1   1          There are no active problems to display for this patient.   Past Medical History  Diagnosis Date  . Chlamydia contact, treated 2010    Past Surgical History  Procedure Laterality Date  . Wisdom tooth extraction      Current Outpatient Prescriptions  Medication Sig Dispense Refill  . Iron-FA-B Cmp-C-Biot-Probiotic (FUSION PLUS) CAPS Take 1 capsule by mouth at bedtime. 30 capsule 2  . Prenatal Vit-Fe Fumarate-FA (PRENATAL MULTIVITAMIN) TABS tablet Take 1 tablet by mouth daily at 12 noon.     No current facility-administered medications for this visit.     ALLERGIES: Review of patient's allergies indicates no known allergies.  Family History  Problem Relation Age of Onset  . Hypertension Mother   . Hypertension Maternal Grandmother   . Heart disease Maternal Grandmother   . Heart disease Maternal Grandfather   . Depression Mother     History   Social History  . Marital Status:  Married    Spouse Name: N/A  . Number of Children: N/A  . Years of Education: N/A   Occupational History  . Not on file.   Social History Main Topics  . Smoking status: Never Smoker   . Smokeless tobacco: Never Used  . Alcohol Use: No  . Drug Use: No  . Sexual Activity:    Partners: Male    Birth Control/ Protection: None   Other Topics Concern  . Not on file   Social History Narrative    ROS:  Pertinent items are noted in HPI.  PHYSICAL EXAMINATION:    BP 110/60 mmHg  Pulse 80  Resp 16  Wt 140 lb (63.504 kg)  LMP 10/20/2014    General appearance: alert, cooperative and appears stated age Abdomen: soft, non-tender; bowel sounds normal; no masses,  no organomegaly  Pelvic: External genitalia:  no lesions              Urethra:  normal appearing urethra with no masses, tenderness or lesions              Bartholins and Skenes: normal                 Vagina: normal appearing vagina watery brown d/c noted, odor noted              Cervix: closed, appears to be  a small amount of tissue in the cervix Bimanual Exam:  Uterus:  Anteverted, mildly tender, minimally enlarged.  Adnexa: no masses or tenderness  Chaperone was present for exam.  Wet prep: ++Clue, no trich, rare WBC KOH: negative for yeast PH: 5  ASSESSMENT 1st trimester bleeding H/O chlamydia Bacterial vaginitis  PLAN  Ultrasound today most cw failed pregnancy, some material seen in the LUS Will check wet prep Discussed with the patient her risk of ectopic pregnancy and the need for early blood monitoring and ultrasound   An After Visit Summary was printed and given to the patient.     Addendum: ultrasound cw likely SAB, appears to have gestational sac remnants in the LUS Will check a BhcG now and on Monday Patient to call with fevers, heavy bleeding, or any other concerns

## 2014-12-22 LAB — HCG, QUANTITATIVE, PREGNANCY: hCG, Beta Chain, Quant, S: 614.3 m[IU]/mL

## 2014-12-25 ENCOUNTER — Other Ambulatory Visit: Payer: BLUE CROSS/BLUE SHIELD

## 2014-12-25 ENCOUNTER — Telehealth: Payer: Self-pay | Admitting: Obstetrics and Gynecology

## 2014-12-25 NOTE — Telephone Encounter (Signed)
Patient canceled her hcg quant appointment and would like to reschedule. Patient is not of the time frame she is allowed for this lab. Last seen 12/21/14.

## 2014-12-25 NOTE — Telephone Encounter (Signed)
Call to patient. She was to have hcg quant today. HCG Quant 12/21/14 was 614.3.  Patient states she absolutely cannot come today for lab work due to work commitments. She denies complaints. No fevers, no abdominal pain. She states her bleeding is "very light".   Patient scheduled for lab appointment tomorrow morning at 0845, advised patient very important to keep lab appointment to ensure levels are dropping appropriately. She is advised to call back if develops heavy bleeding, fevers, or any abdominal pain.  Patient verbalized understanding. Advised would return call if any additional instructions from Dr. Oscar LaJertson.   Reviewed with Dr. Oscar LaJertson, okay for lab appointment for tomorrow at [phone] as scheduled.

## 2014-12-26 ENCOUNTER — Other Ambulatory Visit (INDEPENDENT_AMBULATORY_CARE_PROVIDER_SITE_OTHER): Payer: BLUE CROSS/BLUE SHIELD

## 2014-12-26 DIAGNOSIS — O209 Hemorrhage in early pregnancy, unspecified: Secondary | ICD-10-CM

## 2014-12-27 ENCOUNTER — Other Ambulatory Visit: Payer: Self-pay | Admitting: Obstetrics and Gynecology

## 2014-12-27 DIAGNOSIS — O209 Hemorrhage in early pregnancy, unspecified: Secondary | ICD-10-CM

## 2014-12-27 LAB — HCG, QUANTITATIVE, PREGNANCY: hCG, Beta Chain, Quant, S: 91.3 m[IU]/mL

## 2014-12-28 ENCOUNTER — Telehealth: Payer: Self-pay

## 2014-12-28 NOTE — Telephone Encounter (Signed)
Patient called back. Results given and she scheduled lab appt for 01/03/15.

## 2014-12-28 NOTE — Telephone Encounter (Signed)
Left message to call Kaitlyn at [phone].  Notes Recorded by Romualdo Bolk, MD on 12/27/2014 at 10:22 AM Please inform the patient that her BhcG is coming down, cw pregnancy loss. I would recommend that we check weekly BhcG's until they are negative given her h/o ectopic pregnancy. U/S was suggestive of SAB, but not definitive. Order placed for next week.

## 2014-12-29 NOTE — Telephone Encounter (Signed)
Spoke with patient. Patient states that she was told yesterday that she has a "history of ectopic pregnancies." Advised of notes from Dr.Jertson from 12/21/2014 appointment. Patient is agreeable. Advised there is a risk for ectopic pregnancy not that she has one at this time. Advised of importance of monitoring her Hcg levels to ensure they decline appropriately. Patient is agreeable. Has follow up lab appointment scheduled for 01/03/2015 at 1:30pm. Patient is agreeable to date and time.  PLAN  Ultrasound today most cw failed pregnancy, some material seen in the LUS Will check wet prep Discussed with the patient her risk of ectopic pregnancy and the need for early blood monitoring and ultrasound  An After Visit Summary was printed and given to the patient.  Routing to provider for final review. Patient agreeable to disposition. Will close encounter.   Patient aware provider will review message and nurse will return call if any additional advice or change of disposition.

## 2014-12-29 NOTE — Telephone Encounter (Signed)
Left message to call Kaitlyn at [phone]. 

## 2014-12-29 NOTE — Telephone Encounter (Signed)
Returning call.

## 2015-01-03 ENCOUNTER — Other Ambulatory Visit (INDEPENDENT_AMBULATORY_CARE_PROVIDER_SITE_OTHER): Payer: BLUE CROSS/BLUE SHIELD

## 2015-01-03 DIAGNOSIS — O209 Hemorrhage in early pregnancy, unspecified: Secondary | ICD-10-CM

## 2015-01-04 LAB — HCG, QUANTITATIVE, PREGNANCY: hCG, Beta Chain, Quant, S: 5.6 m[IU]/mL

## 2015-01-08 ENCOUNTER — Telehealth: Payer: Self-pay | Admitting: Obstetrics and Gynecology

## 2015-01-08 ENCOUNTER — Telehealth: Payer: Self-pay | Admitting: *Deleted

## 2015-01-08 NOTE — Telephone Encounter (Signed)
Patient says she is returning a call to Ebony. No open tc note.

## 2015-01-08 NOTE — Telephone Encounter (Signed)
LMTC -eh  

## 2015-01-08 NOTE — Telephone Encounter (Signed)
Lorri Frederick, CMA at 01/08/2015 8:51 AM     Status: Signed       Expand All Collapse All   LMTC -eh             Lorri Frederick, CMA at 01/08/2015 8:51 AM     Status: Signed       Expand All Collapse All   ----- Message from Romualdo Bolk, MD sent at 01/04/2015 6:48 PM EDT ----- Please inform the patient that her Bhcg has dropped nicely, I don't think we have to check another one. She should call us early on with another pregnancy for blood work and an early ultrasound.       Spoke with patient. Results given as seen above. Patient is agreeable and verbalizes understanding.  Routing to provider for final review. Patient agreeable to disposition. Will close encounter.   Patient aware provider will review message and nurse will return call if any additional advice or change of disposition.

## 2015-01-08 NOTE — Telephone Encounter (Signed)
-----   Message from Romualdo Bolk, MD sent at 01/04/2015  6:48 PM EDT ----- Please inform the patient that her Bhcg has dropped nicely, I don't think we have to check another one. She should call us early on with another pregnancy for blood work and an early ultrasound.

## 2015-03-08 ENCOUNTER — Encounter: Payer: Self-pay | Admitting: Obstetrics and Gynecology

## 2015-04-26 ENCOUNTER — Encounter: Payer: Self-pay | Admitting: Obstetrics and Gynecology

## 2015-05-11 ENCOUNTER — Ambulatory Visit: Payer: BLUE CROSS/BLUE SHIELD | Admitting: Certified Nurse Midwife

## 2015-05-17 ENCOUNTER — Encounter (HOSPITAL_COMMUNITY): Payer: Self-pay | Admitting: Emergency Medicine

## 2015-05-17 ENCOUNTER — Emergency Department (HOSPITAL_COMMUNITY)
Admission: EM | Admit: 2015-05-17 | Discharge: 2015-05-17 | Disposition: A | Discharge status: Home / Self Care | Payer: No Typology Code available for payment source | Attending: Emergency Medicine | Admitting: Emergency Medicine

## 2015-05-17 DIAGNOSIS — S299XXA Unspecified injury of thorax, initial encounter: Secondary | ICD-10-CM | POA: Insufficient documentation

## 2015-05-17 DIAGNOSIS — Z792 Long term (current) use of antibiotics: Secondary | ICD-10-CM | POA: Diagnosis not present

## 2015-05-17 DIAGNOSIS — Y998 Other external cause status: Secondary | ICD-10-CM | POA: Diagnosis not present

## 2015-05-17 DIAGNOSIS — Z8619 Personal history of other infectious and parasitic diseases: Secondary | ICD-10-CM | POA: Insufficient documentation

## 2015-05-17 DIAGNOSIS — Y9389 Activity, other specified: Secondary | ICD-10-CM | POA: Diagnosis not present

## 2015-05-17 DIAGNOSIS — Y92410 Unspecified street and highway as the place of occurrence of the external cause: Secondary | ICD-10-CM | POA: Diagnosis not present

## 2015-05-17 DIAGNOSIS — S79922A Unspecified injury of left thigh, initial encounter: Secondary | ICD-10-CM | POA: Insufficient documentation

## 2015-05-17 DIAGNOSIS — Z79899 Other long term (current) drug therapy: Secondary | ICD-10-CM | POA: Diagnosis not present

## 2015-05-17 DIAGNOSIS — S199XXA Unspecified injury of neck, initial encounter: Secondary | ICD-10-CM | POA: Insufficient documentation

## 2015-05-17 MED ORDER — IBUPROFEN 600 MG PO TABS: 600.0000 mg | ORAL_TABLET | Freq: Four times a day (QID) | ORAL | Status: DC | PRN

## 2015-05-17 NOTE — ED Notes (Signed)
Placed pt in gown

## 2015-05-17 NOTE — Discharge Instructions (Signed)
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
## 2015-05-19 IMAGING — US US OB TRANSVAGINAL
1 series · 13 of 28 positions shown · non-contrast
Comparison: None.

CLINICAL DATA: Vaginal bleeding

EXAM:
OBSTETRIC <14 WK US AND TRANSVAGINAL OB US
TECHNIQUE: Both transabdominal and transvaginal ultrasound examinations were
performed for complete evaluation of the gestation as well as the
maternal uterus, adnexal regions, and pelvic cul-de-sac.
Transvaginal technique was performed to assess early pregnancy.

[Series 1: us ob comp less 14 wks · 46 acquisitions, 13 frames shown]
[im 2/46]
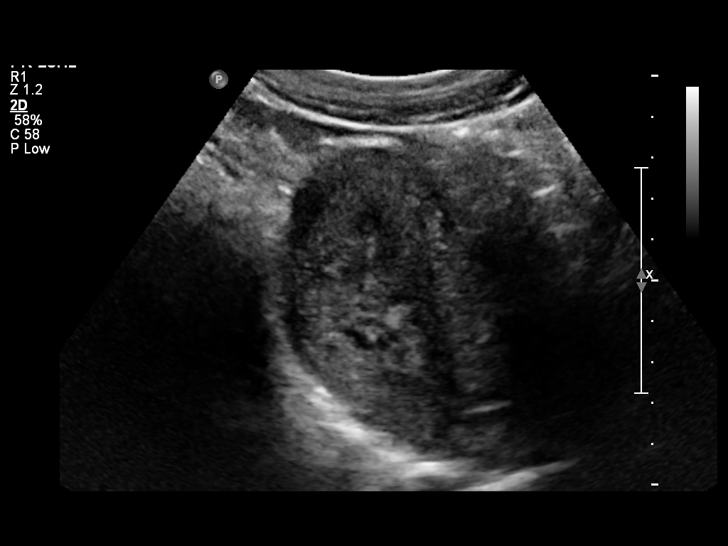
[im 6/46]
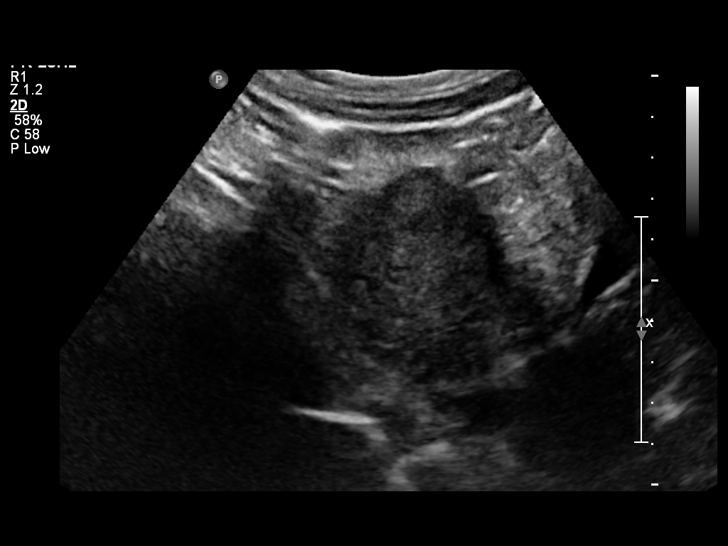
[im 9/46]
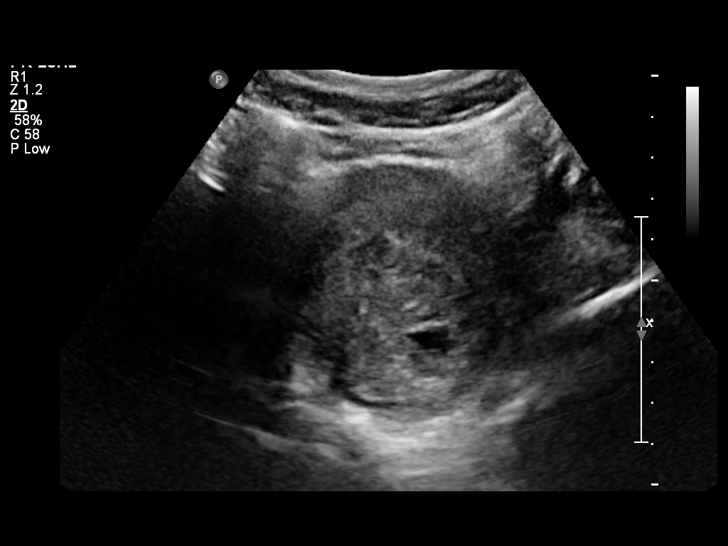
[im 12/46]
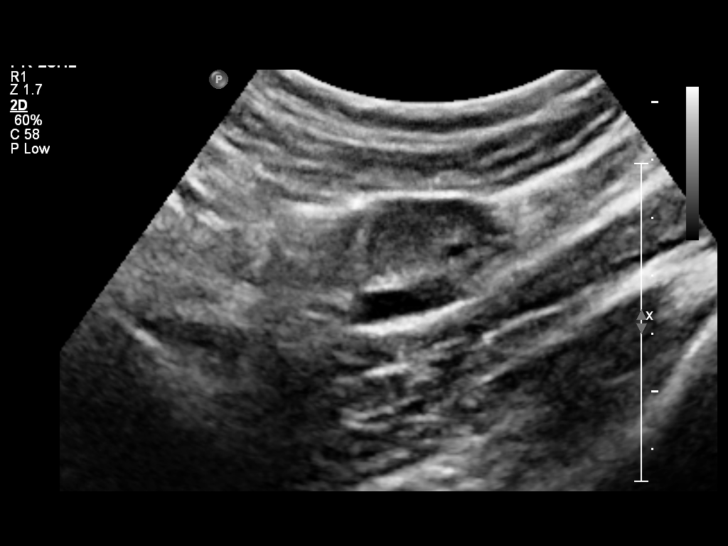
[im 16/46]
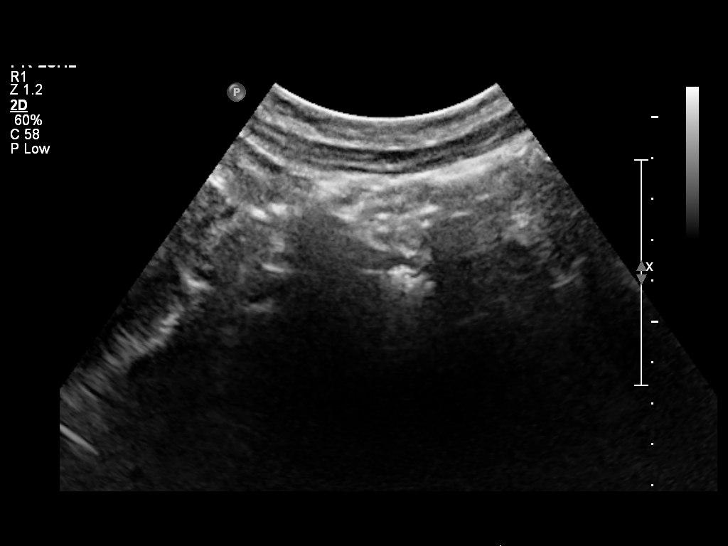
[im 19/46]
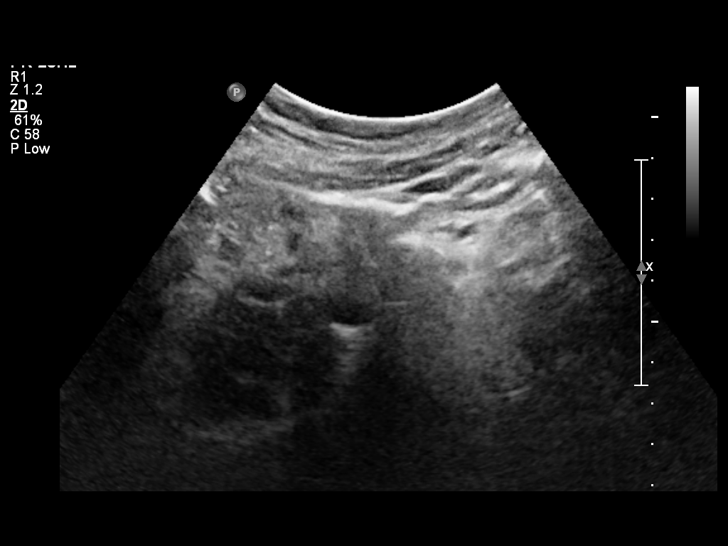
[im 24/46]
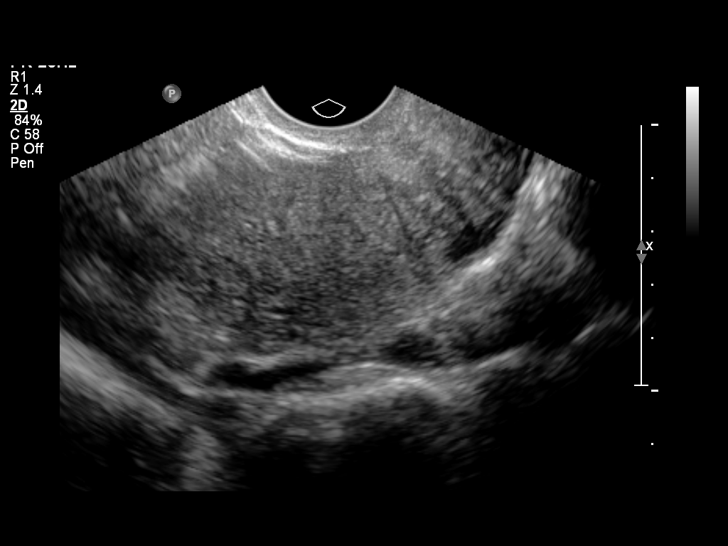
[im 27/46]
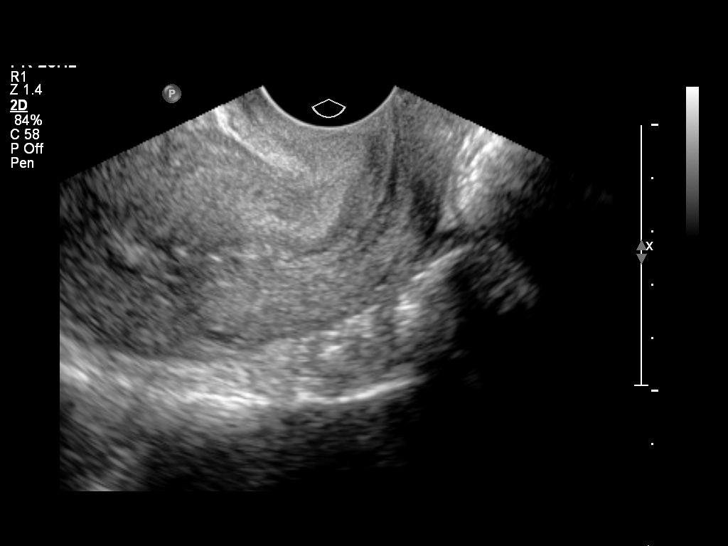
[im 31/46]
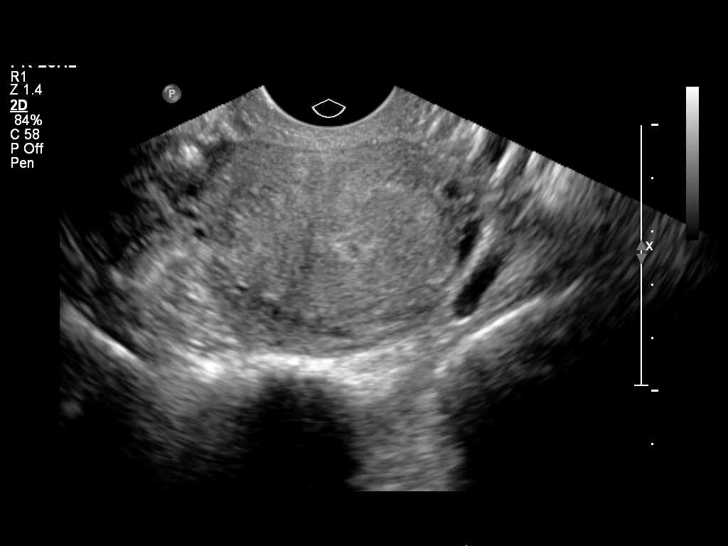
[im 34/46]
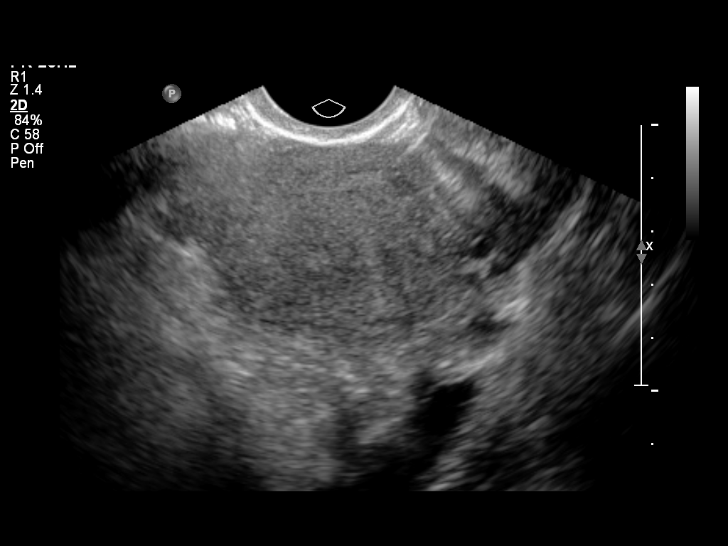
[im 37/46]
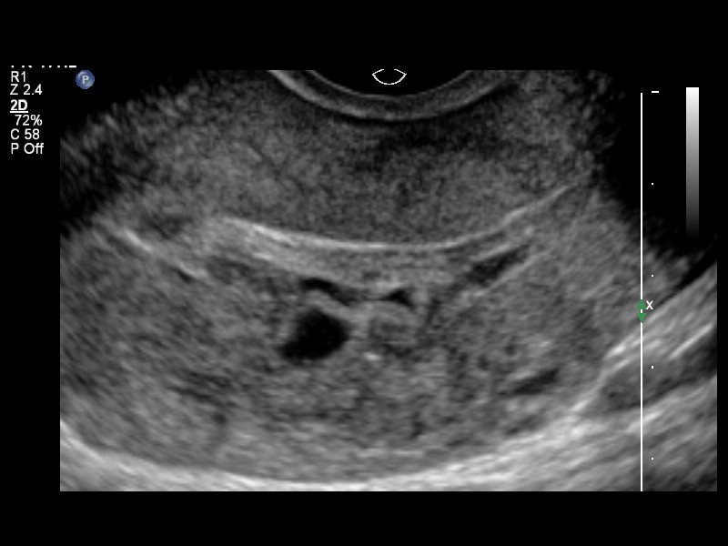
[im 41/46]
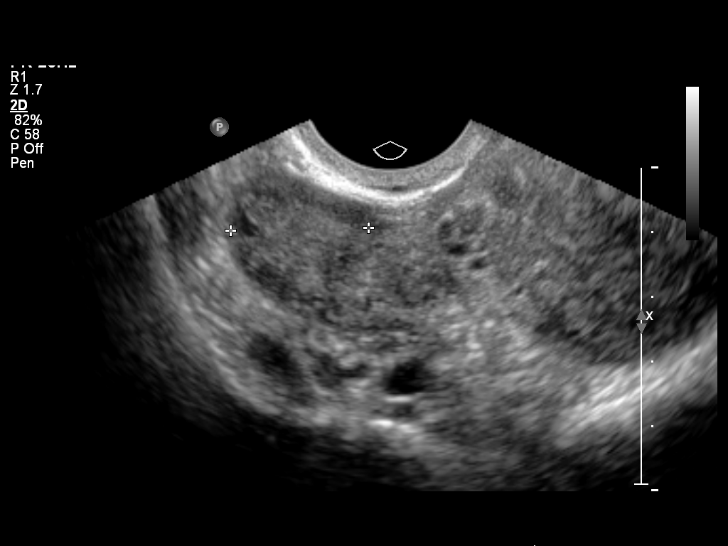
[im 44/46]
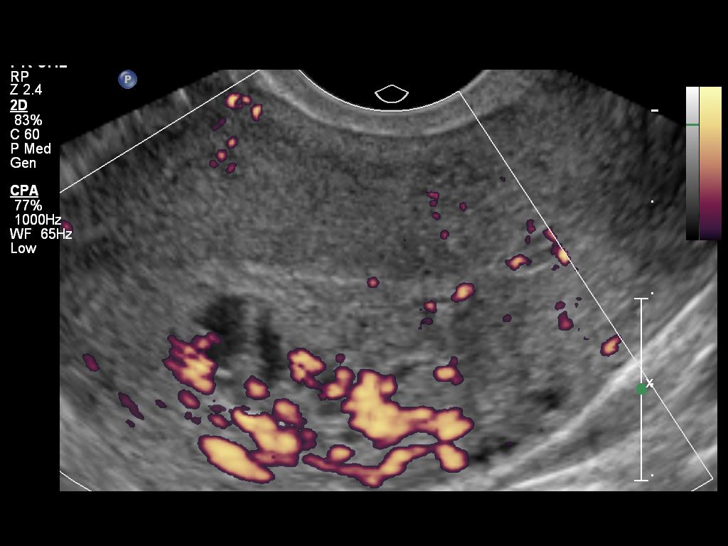

[13 of 28 positions shown; findings below may reference images not displayed]

FINDINGS: Intrauterine gestational sac: An irregular area of decreased
echogenicity is noted which may represent a gestational sac although
it does not represent a normal appearing gestational sac. Given the
patient's last menstrual period, gestational sac and fetal pole
should be identified.

Maternal uterus/adnexae: Significant increased vascularity is noted
surrounding the irregular hypoechoic structures in the endometrial
canal. The ovaries are within normal limits bilaterally.
IMPRESSION: Irregular area of decreased echogenicity in the endometrial canal
with significant increased vascularity. No definitive gestational
sac is seen. Given the patient's current hemorrhage, this likely
represents a miscarriage in progress. Correlation with the patient's
pending beta HCG level is recommended. Followup examination could be
performed as clinically indicated.

## 2015-06-10 NOTE — L&D Delivery Note (Signed)
Delivery Note At  a viable and healthy female was delivered via  (Presentation: Right Occiput  ; Anterior ).  APGAR: 8, 9; weight pending  .   Placenta status: spontaneous, intact.  Cord: 3V with loose nuchal x 1 reduced on the perineum    Anesthesia:  Epidural Episiotomy:  None Lacerations:  None Suture Repair: NA Est. Blood Loss (mL):  50 cc  Mom to postpartum.  Baby to Couplet care / Skin to Skin.  Holly Reeves H. 01/08/2016, 4:02 PM

## 2015-07-25 LAB — OB RESULTS CONSOLE GBS: STREP GROUP B AG: POSITIVE

## 2016-01-08 ENCOUNTER — Encounter (HOSPITAL_COMMUNITY): Payer: Self-pay

## 2016-01-08 ENCOUNTER — Inpatient Hospital Stay (HOSPITAL_COMMUNITY)
Admission: AD | Admit: 2016-01-08 | Discharge: 2016-01-10 | DRG: 774 | Disposition: A | Discharge status: Home / Self Care | Payer: BLUE CROSS/BLUE SHIELD | Source: Ambulatory Visit | Attending: Obstetrics and Gynecology | Admitting: Obstetrics and Gynecology

## 2016-01-08 ENCOUNTER — Inpatient Hospital Stay (HOSPITAL_COMMUNITY): Payer: BLUE CROSS/BLUE SHIELD | Admitting: Anesthesiology

## 2016-01-08 DIAGNOSIS — Z8249 Family history of ischemic heart disease and other diseases of the circulatory system: Secondary | ICD-10-CM | POA: Diagnosis not present

## 2016-01-08 DIAGNOSIS — Z818 Family history of other mental and behavioral disorders: Secondary | ICD-10-CM | POA: Diagnosis not present

## 2016-01-08 DIAGNOSIS — Z3483 Encounter for supervision of other normal pregnancy, third trimester: Secondary | ICD-10-CM | POA: Diagnosis present

## 2016-01-08 DIAGNOSIS — O99824 Streptococcus B carrier state complicating childbirth: Secondary | ICD-10-CM | POA: Diagnosis present

## 2016-01-08 DIAGNOSIS — O9832 Other infections with a predominantly sexual mode of transmission complicating childbirth: Secondary | ICD-10-CM | POA: Diagnosis present

## 2016-01-08 DIAGNOSIS — Z3490 Encounter for supervision of normal pregnancy, unspecified, unspecified trimester: Secondary | ICD-10-CM

## 2016-01-08 DIAGNOSIS — Z3A40 40 weeks gestation of pregnancy: Secondary | ICD-10-CM | POA: Diagnosis not present

## 2016-01-08 DIAGNOSIS — A6000 Herpesviral infection of urogenital system, unspecified: Secondary | ICD-10-CM | POA: Diagnosis present

## 2016-01-08 LAB — TYPE AND SCREEN
ABO/RH(D): O POS
ANTIBODY SCREEN: NEGATIVE

## 2016-01-08 LAB — CBC
HEMATOCRIT: 35.5 % — AB (ref 36.0–46.0)
HEMOGLOBIN: 11.9 g/dL — AB (ref 12.0–15.0)
MCH: 27.7 pg (ref 26.0–34.0)
MCHC: 33.5 g/dL (ref 30.0–36.0)
MCV: 82.8 fL (ref 78.0–100.0)
Platelets: 283 10*3/uL (ref 150–400)
RBC: 4.29 MIL/uL (ref 3.87–5.11)
RDW: 14.9 % (ref 11.5–15.5)
WBC: 8.5 10*3/uL (ref 4.0–10.5)

## 2016-01-08 LAB — RPR: RPR Ser Ql: NONREACTIVE

## 2016-01-08 MED ORDER — DIPHENHYDRAMINE HCL 50 MG/ML IJ SOLN: 12.5000 mg | INTRAMUSCULAR | Status: DC | PRN

## 2016-01-08 MED ORDER — PHENYLEPHRINE 40 MCG/ML (10ML) SYRINGE FOR IV PUSH (FOR BLOOD PRESSURE SUPPORT)
80.0000 ug | PREFILLED_SYRINGE | INTRAVENOUS | Status: DC | PRN
  Filled 2016-01-08: qty 5

## 2016-01-08 MED ORDER — SOD CITRATE-CITRIC ACID 500-334 MG/5ML PO SOLN: 30.0000 mL | ORAL | Status: DC | PRN

## 2016-01-08 MED ORDER — FLEET ENEMA 7-19 GM/118ML RE ENEM: 1.0000 | ENEMA | RECTAL | Status: DC | PRN

## 2016-01-08 MED ORDER — PENICILLIN G POTASSIUM 5000000 UNITS IJ SOLR
2.5000 10*6.[IU] | INTRAVENOUS | Status: DC
  Administered 2016-01-08: 2.5 10*6.[IU] via INTRAVENOUS
  Filled 2016-01-08 (×4): qty 2.5

## 2016-01-08 MED ORDER — ZOLPIDEM TARTRATE 5 MG PO TABS: 5.0000 mg | ORAL_TABLET | Freq: Every evening | ORAL | Status: DC | PRN

## 2016-01-08 MED ORDER — PHENYLEPHRINE 40 MCG/ML (10ML) SYRINGE FOR IV PUSH (FOR BLOOD PRESSURE SUPPORT)
80.0000 ug | PREFILLED_SYRINGE | INTRAVENOUS | Status: DC | PRN
  Filled 2016-01-08: qty 5
  Filled 2016-01-08: qty 10

## 2016-01-08 MED ORDER — ACETAMINOPHEN 325 MG PO TABS: 650.0000 mg | ORAL_TABLET | ORAL | Status: DC | PRN

## 2016-01-08 MED ORDER — LACTATED RINGERS IV SOLN: 500.0000 mL | Freq: Once | INTRAVENOUS | Status: AC

## 2016-01-08 MED ORDER — LACTATED RINGERS IV SOLN: 500.0000 mL | INTRAVENOUS | Status: DC | PRN

## 2016-01-08 MED ORDER — DIPHENHYDRAMINE HCL 25 MG PO CAPS: 25.0000 mg | ORAL_CAPSULE | Freq: Four times a day (QID) | ORAL | Status: DC | PRN

## 2016-01-08 MED ORDER — EPHEDRINE 5 MG/ML INJ
10.0000 mg | INTRAVENOUS | Status: DC | PRN
  Filled 2016-01-08: qty 4

## 2016-01-08 MED ORDER — LACTATED RINGERS IV SOLN
INTRAVENOUS | Status: DC
  Administered 2016-01-08 (×2): via INTRAVENOUS

## 2016-01-08 MED ORDER — OXYTOCIN 40 UNITS IN LACTATED RINGERS INFUSION - SIMPLE MED: 2.5000 [IU]/h | INTRAVENOUS | Status: DC

## 2016-01-08 MED ORDER — OXYCODONE-ACETAMINOPHEN 5-325 MG PO TABS: 2.0000 | ORAL_TABLET | ORAL | Status: DC | PRN

## 2016-01-08 MED ORDER — METHYLERGONOVINE MALEATE 0.2 MG PO TABS: 0.2000 mg | ORAL_TABLET | ORAL | Status: DC | PRN

## 2016-01-08 MED ORDER — ONDANSETRON HCL 4 MG/2ML IJ SOLN: 4.0000 mg | INTRAMUSCULAR | Status: DC | PRN

## 2016-01-08 MED ORDER — DIBUCAINE 1 % RE OINT: 1.0000 "application " | TOPICAL_OINTMENT | RECTAL | Status: DC | PRN

## 2016-01-08 MED ORDER — PENICILLIN G POTASSIUM 5000000 UNITS IJ SOLR
5.0000 10*6.[IU] | Freq: Once | INTRAMUSCULAR | Status: AC
  Administered 2016-01-08: 5 10*6.[IU] via INTRAVENOUS
  Filled 2016-01-08: qty 5

## 2016-01-08 MED ORDER — LIDOCAINE HCL (PF) 1 % IJ SOLN
30.0000 mL | INTRAMUSCULAR | Status: DC | PRN
  Filled 2016-01-08: qty 30

## 2016-01-08 MED ORDER — LACTATED RINGERS IV SOLN
500.0000 mL | Freq: Once | INTRAVENOUS | Status: AC
Start: 2016-01-08 — End: 2016-01-08
  Administered 2016-01-08: 500 mL via INTRAVENOUS

## 2016-01-08 MED ORDER — WITCH HAZEL-GLYCERIN EX PADS: 1.0000 "application " | MEDICATED_PAD | CUTANEOUS | Status: DC | PRN

## 2016-01-08 MED ORDER — OXYTOCIN BOLUS FROM INFUSION: 500.0000 mL | Freq: Once | INTRAVENOUS | Status: DC

## 2016-01-08 MED ORDER — OXYCODONE-ACETAMINOPHEN 5-325 MG PO TABS: 1.0000 | ORAL_TABLET | ORAL | Status: DC | PRN

## 2016-01-08 MED ORDER — COCONUT OIL OIL
1.0000 "application " | TOPICAL_OIL | Status: DC | PRN
  Administered 2016-01-09: 1 via TOPICAL
  Filled 2016-01-08: qty 120

## 2016-01-08 MED ORDER — METHYLERGONOVINE MALEATE 0.2 MG/ML IJ SOLN: 0.2000 mg | INTRAMUSCULAR | Status: DC | PRN

## 2016-01-08 MED ORDER — ONDANSETRON HCL 4 MG/2ML IJ SOLN: 4.0000 mg | Freq: Four times a day (QID) | INTRAMUSCULAR | Status: DC | PRN

## 2016-01-08 MED ORDER — TETANUS-DIPHTH-ACELL PERTUSSIS 5-2.5-18.5 LF-MCG/0.5 IM SUSP: 0.5000 mL | Freq: Once | INTRAMUSCULAR | Status: DC

## 2016-01-08 MED ORDER — OXYTOCIN 40 UNITS IN LACTATED RINGERS INFUSION - SIMPLE MED
INTRAVENOUS | Status: AC
  Administered 2016-01-08: 2 m[IU]/min via INTRAVENOUS
  Filled 2016-01-08: qty 1000

## 2016-01-08 MED ORDER — BENZOCAINE-MENTHOL 20-0.5 % EX AERO: 1.0000 "application " | INHALATION_SPRAY | CUTANEOUS | Status: DC | PRN

## 2016-01-08 MED ORDER — OXYTOCIN 40 UNITS IN LACTATED RINGERS INFUSION - SIMPLE MED
1.0000 m[IU]/min | INTRAVENOUS | Status: DC
  Administered 2016-01-08: 2 m[IU]/min via INTRAVENOUS

## 2016-01-08 MED ORDER — IBUPROFEN 600 MG PO TABS
600.0000 mg | ORAL_TABLET | Freq: Four times a day (QID) | ORAL | Status: DC
  Administered 2016-01-08 – 2016-01-10 (×7): 600 mg via ORAL
  Filled 2016-01-08 (×7): qty 1

## 2016-01-08 MED ORDER — LIDOCAINE HCL (PF) 1 % IJ SOLN
INTRAMUSCULAR | Status: DC | PRN
  Administered 2016-01-08 (×2): 6 mL via EPIDURAL

## 2016-01-08 MED ORDER — TERBUTALINE SULFATE 1 MG/ML IJ SOLN
0.2500 mg | Freq: Once | INTRAMUSCULAR | Status: DC | PRN
  Filled 2016-01-08: qty 1

## 2016-01-08 MED ORDER — SENNOSIDES-DOCUSATE SODIUM 8.6-50 MG PO TABS
2.0000 | ORAL_TABLET | ORAL | Status: DC
  Administered 2016-01-09 (×2): 2 via ORAL
  Filled 2016-01-08 (×2): qty 2

## 2016-01-08 MED ORDER — FENTANYL 2.5 MCG/ML BUPIVACAINE 1/10 % EPIDURAL INFUSION (WH - ANES)
14.0000 mL/h | INTRAMUSCULAR | Status: DC | PRN
  Administered 2016-01-08: 14 mL/h via EPIDURAL
  Filled 2016-01-08: qty 125

## 2016-01-08 MED ORDER — ONDANSETRON HCL 4 MG PO TABS: 4.0000 mg | ORAL_TABLET | ORAL | Status: DC | PRN

## 2016-01-08 MED ORDER — SIMETHICONE 80 MG PO CHEW: 80.0000 mg | CHEWABLE_TABLET | ORAL | Status: DC | PRN

## 2016-01-08 MED ORDER — BUTORPHANOL TARTRATE 1 MG/ML IJ SOLN: 1.0000 mg | INTRAMUSCULAR | Status: DC | PRN

## 2016-01-08 MED ORDER — PRENATAL MULTIVITAMIN CH
1.0000 | ORAL_TABLET | Freq: Every day | ORAL | Status: DC
  Administered 2016-01-09: 1 via ORAL
  Filled 2016-01-08: qty 1

## 2016-01-08 NOTE — Anesthesia Preprocedure Evaluation (Signed)
Anesthesia Evaluation  Patient identified by MRN, date of birth, ID band Patient awake    Reviewed: Allergy & Precautions, NPO status , Patient's Chart, lab work & pertinent test results  History of Anesthesia Complications Negative for: history of anesthetic complications  Airway Mallampati: I  TM Distance: >3 FB Neck ROM: Full    Dental  (+) Dental Advisory Given   Pulmonary neg pulmonary ROS,    breath sounds clear to auscultation       Cardiovascular negative cardio ROS   Rhythm:Regular Rate:Normal     Neuro/Psych negative neurological ROS     GI/Hepatic negative GI ROS, Neg liver ROS,   Endo/Other  negative endocrine ROS  Renal/GU negative Renal ROS     Musculoskeletal   Abdominal   Peds  Hematology Hb 11.9, plt 283K   Anesthesia Other Findings   Reproductive/Obstetrics (+) Pregnancy                             Anesthesia Physical Anesthesia Plan  ASA: II  Anesthesia Plan: Epidural   Post-op Pain Management:    Induction:   Airway Management Planned: Natural Airway  Additional Equipment:   Intra-op Plan:   Post-operative Plan:   Informed Consent: I have reviewed the patients History and Physical, chart, labs and discussed the procedure including the risks, benefits and alternatives for the proposed anesthesia with the patient or authorized representative who has indicated his/her understanding and acceptance.     Plan Discussed with: CRNA and Surgeon  Anesthesia Plan Comments: (Patient identified. Risks/Benefits/Options discussed with patient including but not limited to bleeding, infection, nerve damage, paralysis, failed block, incomplete pain control, headache, blood pressure changes, nausea, vomiting, reactions to medication both or allergic, itching and postpartum back pain. Confirmed with bedside nurse the patient's most recent platelet count. Confirmed with  patient that they are not currently taking any anticoagulation, have any bleeding history or any family history of bleeding disorders. Patient expressed understanding and wished to proceed. All questions were answered. )        Anesthesia Quick Evaluation

## 2016-01-08 NOTE — Progress Notes (Signed)
Delivery of live vaible female by Dr Tenny Craw. APGARS 9,9

## 2016-01-08 NOTE — MAU Note (Signed)
PT  SAYS  SHE  FELT  UC  STRONG  SINCE  0530.    PNC  - WITH DR  HORVATH.     VE IN OFFICE - 1-2 CM  YESTERDAY.     DENIES HSV AND MRSA.   GBS-   POSITIVE.

## 2016-01-08 NOTE — Anesthesia Procedure Notes (Signed)
Epidural Patient location during procedure: OB Start time: 01/08/2016 11:32 AM End time: 01/08/2016 12:05 PM  Staffing Anesthesiologist: Jairo Ben Performed: anesthesiologist   Preanesthetic Checklist Completed: patient identified, surgical consent, pre-op evaluation, timeout performed, IV checked, risks and benefits discussed and monitors and equipment checked  Epidural Patient position: sitting Prep: site prepped and draped and DuraPrep Patient monitoring: heart rate, continuous pulse ox and blood pressure Approach: midline Location: L3-L4 Injection technique: LOR air  Needle:  Needle type: Tuohy  Needle gauge: 17 G Needle length: 9 cm Needle insertion depth: 7.5 cm Catheter type: closed end flexible Catheter size: 19 Gauge Catheter at skin depth: 12 cm Test dose: negative (1% lidocaine)  Additional Notes Pt identified in Labor room.  Monitors applied. Working IV access confirmed. Sterile prep, drape lumbar spine.  1% lido local L 3,4.  #17ga Touhy LOR air at 7.5 cm L 2,3, cath in easily to 12 cm skin. Test dose OK, cath dosed and infusion begun.  Patient asymptomatic, VSS, no heme aspirated, tolerated well.  Sandford Craze, MDReason for block:procedure for pain

## 2016-01-08 NOTE — Anesthesia Pain Management Evaluation Note (Signed)
  CRNA Pain Management Visit Note  Patient: Holly Reeves, 30 y.o., female  "Hello I am a member of the anesthesia team at [facility]. We have an anesthesia team available at all times to provide care throughout the hospital, including epidural management and anesthesia for C-section. I don't know your plan for the delivery whether it a natural birth, water birth, IV sedation, nitrous supplementation, doula or epidural, but we want to meet your pain goals."   1.Was your pain managed to your expectations on prior hospitalizations?   No prior hospitalizations  2.What is your expectation for pain management during this hospitalization?     Epidural  3.How can we help you reach that goal? epidural  Record the patient's initial score and the patient's pain goal.   Pain: 5  Pain Goal: 8 [facility] wants you to be able to say your pain was always managed very well.  Edison Pace 01/08/2016

## 2016-01-08 NOTE — H&P (Signed)
Holly Reeves is a 30 y.o. female presenting for leaking fluid  [name] presents for leaking of fluid and was confirmed to have spontaneously broken her water. Her pregnancy has been uncomplicated to this point. 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 This history is confidential OB History    Gravida Para Term Preterm AB Living   5 1 1   2 1    SAB TAB Ectopic Multiple Live Births   1 1     1      Past Medical History:  Diagnosis Date  . Chlamydia contact, treated 2010   Past Surgical History:  Procedure Laterality Date  . WISDOM TOOTH EXTRACTION     Family History: family history includes Depression in her mother; Heart disease in her maternal grandfather and maternal grandmother; Hypertension in her maternal grandmother and mother. Social History:  reports that she has never smoked. She has never used smokeless tobacco. She reports that she drinks alcohol. She reports that she does not use drugs.     Maternal Diabetes: No, abnormal 1hr, 3 hr gtt WNL Genetic Screening: Normal Maternal Ultrasounds/Referrals: Normal Fetal Ultrasounds or other Referrals:  None Maternal Substance Abuse:  No Significant Maternal Medications:  None Significant Maternal Lab Results:  None Other Comments:  None  ROS History Dilation: Lip/rim Effacement (%): 100 Station: 0 Exam by:: Holly Rave rN Blood pressure 129/79, pulse 87, temperature 97.7 F (36.5 C), temperature source Oral, resp. rate 18, height 5\' 5"  (1.651 m), weight 82.6 kg (182 lb), last menstrual period 04/09/2015, SpO2 100 %, unknown if currently breastfeeding. Exam Physical Exam  Prenatal labs: ABO, Rh: --/--/O POS (08/01 0735) Antibody: NEG (08/01 0735) Rubella:  Immune RPR: Non Reactive (08/01 0735)  HBsAg:   Neg HIV:   NR GBS: Positive (02/15 0000)   Assessment/Plan: 1) Admit 2) Epidural on request 3) Pitocin augmentation PRN   Holly Reeves. 01/08/2016, 3:32 PM

## 2016-01-09 LAB — CBC
HCT: 31.6 % — ABNORMAL LOW (ref 36.0–46.0)
Hemoglobin: 10.5 g/dL — ABNORMAL LOW (ref 12.0–15.0)
MCH: 27.2 pg (ref 26.0–34.0)
MCHC: 33.2 g/dL (ref 30.0–36.0)
MCV: 81.9 fL (ref 78.0–100.0)
PLATELETS: 257 10*3/uL (ref 150–400)
RBC: 3.86 MIL/uL — AB (ref 3.87–5.11)
RDW: 15.3 % (ref 11.5–15.5)
WBC: 18 10*3/uL — ABNORMAL HIGH (ref 4.0–10.5)

## 2016-01-09 NOTE — Lactation Note (Signed)
This note was copied from a baby's chart. Lactation Consultation Note  BF first child for 2 months and supplemented w/ formula.  Mother's left nipple has crack and bleeding on tip. Assisted w/ hand expression and latching in football hold. Baby sleepy but latched w/ chin tug. Reviewed basics, provided hand pump. Mom encouraged to feed baby 8-12 times/24 hours and with feeding cues.  Mom made aware of O/P services, breastfeeding support groups, community resources, and our phone # for post-discharge questions.  Tamela Oddi RN will provided mother w/ coconut oil.  Patient Name: Holly Reeves [id number] Date: 01/09/2016 Reason for consult: Initial assessment   Maternal Data    Feeding Feeding Type: Breast Fed Length of feed: 20 min  LATCH Score/Interventions Latch: Repeated attempts needed to sustain latch, nipple held in mouth throughout feeding, stimulation needed to elicit sucking reflex. Intervention(s): Adjust position;Assist with latch;Breast massage  Audible Swallowing: A few with stimulation Intervention(s): Skin to skin;Hand expression;Alternate breast massage  Type of Nipple: Everted at rest and after stimulation  Comfort (Breast/Nipple): Engorged, cracked, bleeding, large blisters, severe discomfort Problem noted: Cracked, bleeding, blisters, bruises     Hold (Positioning): No assistance needed to correctly position infant at breast. Intervention(s): Breastfeeding basics reviewed;Support Pillows;Position options;Skin to skin  LATCH Score: 6  Lactation Tools Discussed/Used     Consult Status Consult Status: Follow-up Date: 01/10/16 Follow-up type: In-patient    Dahlia Byes [facility] 01/09/2016, 11:10 AM

## 2016-01-09 NOTE — Anesthesia Postprocedure Evaluation (Signed)
Anesthesia Post Note  Patient: Holly Reeves  Procedure(s) Performed: * No procedures listed *  Patient location during evaluation: Mother Baby Anesthesia Type: Epidural Level of consciousness: awake and alert and oriented Pain management: satisfactory to patient Vital Signs Assessment: post-procedure vital signs reviewed and stable Respiratory status: spontaneous breathing and nonlabored ventilation Cardiovascular status: stable Postop Assessment: no headache, no backache, no signs of nausea or vomiting, adequate PO intake and patient able to bend at knees (patient up walking) Anesthetic complications: no     Last Vitals:  Vitals:   01/08/16 2233 01/09/16 0645  BP: (!) 113/58 (!) 101/54  Pulse: 89 69  Resp: 18 18  Temp: 37.2 C 36.5 C    Last Pain:  Vitals:   01/09/16 0654  TempSrc:   PainSc: 2    Pain Goal: Patients Stated Pain Goal: 8 (01/08/16 1000)               Madison Hickman

## 2016-01-09 NOTE — Progress Notes (Signed)
PPD#1 Pt doing well. Lochia wnl. VSSAF IMP/ stable Plan/ routine care.

## 2016-01-10 ENCOUNTER — Inpatient Hospital Stay (HOSPITAL_COMMUNITY): Admission: RE | Admit: 2016-01-10 | Payer: No Typology Code available for payment source | Source: Ambulatory Visit

## 2016-01-10 MED ORDER — IBUPROFEN 600 MG PO TABS: 600.0000 mg | ORAL_TABLET | Freq: Four times a day (QID) | ORAL | 3 refills | Status: DC | PRN

## 2016-01-10 NOTE — Lactation Note (Signed)
This note was copied from a baby's chart. Lactation Consultation Note  Patient Name: Holly Reeves [id number] Date: 01/10/2016 Reason for consult: Follow-up assessment   Follow up with mom of 43 hour old infant. Infant with 4 BF for 10-15 minutes, 5 bottle feeds of formula of 20-55 cc, 3 voids and 3 stools in 24 hours prior to this assessment. LATCH scores 6-9 by bedside RN. Infant weight 7 lb 13.8 oz with 5% weight loss since birth.  Mom had infant latched to right breast in football hold when I went into the room. Infant was nursing intermittently with intermittent swallows. Infant noted to have a narrow gape, showed mom how to help widen gape with chin tug. Enc mom to BF 8-12 x in 24 hours and then offer bottle post BF if needed. Discussed supply and demand with mom and enc mom to pump if she was not planning to put baby to breast to stimulate supply.  Mom noted to have soft compressible breasts with long everted nipples, discussed with mom importance of deepening latch to improve milk transfer. Mom reports she does not feel any fuller today. She reports her nipple tenderness is better today. She is using EBM and coconut oil to nipples.   Reviewed all BF information in Taking Care of Baby and Me Booklet. Reviewed engorgement prevention/treatment. Reviewed I/O and enc parents to maintain feeding log and take to Ped appt. Infant with ped appt tomorrow.  Mom has a manual pump to take home. Enc her to call Insurance company to inquire if they provide a breast pump. Reviewed LC phone #, parents aware of LC phone #, BF Support Groups and OP Services. Enc parents to call with questions/concerns prn.    Maternal Data    Feeding Feeding Type: Breast Milk Length of feed: 20 min  LATCH Score/Interventions Latch: Repeated attempts needed to sustain latch, nipple held in mouth throughout feeding, stimulation needed to elicit sucking reflex. Intervention(s): Assist with latch;Breast  massage;Breast compression  Audible Swallowing: A few with stimulation Intervention(s): Skin to skin;Hand expression;Alternate breast massage  Type of Nipple: Everted at rest and after stimulation  Comfort (Breast/Nipple): Filling, red/small blisters or bruises, mild/mod discomfort Intervention(s): Other (comment) (EBM, Coconut oil)  Problem noted: Mild/Moderate discomfort  Hold (Positioning): Assistance needed to correctly position infant at breast and maintain latch.  LATCH Score: 6  Lactation Tools Discussed/Used     Consult Status Consult Status: Complete Follow-up type: Call as needed    Ed Blalock 01/10/2016, 11:29 AM

## 2016-01-10 NOTE — Discharge Summary (Signed)
Obstetric Discharge Summary Reason for Admission: rupture of membranes Prenatal Procedures: NST and ultrasound Intrapartum Procedures: spontaneous vaginal delivery Postpartum Procedures: none Complications-Operative and Postpartum: none Hemoglobin  Date Value Ref Range Status  01/09/2016 10.5 (L) 12.0 - 15.0 g/dL Final   Hemoglobin, fingerstick  Date Value Ref Range Status  05/10/2014 10.8 (L) 12.0 - 16.0 g/dL Final   HCT  Date Value Ref Range Status  01/09/2016 31.6 (L) 36.0 - 46.0 % Final    Physical Exam:  General: alert, cooperative and no distress Lochia: appropriate Uterine Fundus: firm perineum: healing well, no significant drainage, no dehiscence DVT Evaluation: No evidence of DVT seen on physical exam. Negative Homan's sign. No cords or calf tenderness.  Discharge Diagnoses: Term Pregnancy-delivered  Discharge Information: Date: 01/10/2016 Activity: pelvic rest Diet: routine Medications: PNV and Ibuprofen Condition: stable Instructions: refer to practice specific booklet Discharge to: home   Newborn Data: Live born female  Birth Weight: 8 lb 4 oz (3742 g) APGAR: 9, 9  Home with mother.  Holly Reeves 01/10/2016, 9:29 AM

## 2016-01-10 NOTE — Progress Notes (Signed)
Post Partum Day 2 Subjective: no complaints, up ad lib, voiding, tolerating PO, + flatus and breast and bottle feeding  Objective: Blood pressure 105/63, pulse 70, temperature 98.3 F (36.8 C), temperature source Oral, resp. rate 18, height 5\' 5"  (1.651 m), weight 82.6 kg (182 lb), last menstrual period 04/09/2015, SpO2 98 %, unknown if currently breastfeeding.  Physical Exam:  General: alert, cooperative and no distress Lochia: appropriate Uterine Fundus: firm perineum: healing well, no significant drainage, no dehiscence DVT Evaluation: No evidence of DVT seen on physical exam. Negative Homan's sign. No cords or calf tenderness.   Recent Labs  01/08/16 0735 01/09/16 0529  HGB 11.9* 10.5*  HCT 35.5* 31.6*    Assessment/Plan: Discharge home and Breastfeeding   LOS: 2 days   Holly Reeves STACIA 01/10/2016, 9:27 AM

## 2016-09-09 LAB — OB RESULTS CONSOLE RPR: RPR: NONREACTIVE

## 2016-09-09 LAB — OB RESULTS CONSOLE RUBELLA ANTIBODY, IGM: Rubella: IMMUNE

## 2016-09-09 LAB — OB RESULTS CONSOLE HIV ANTIBODY (ROUTINE TESTING): HIV: NONREACTIVE

## 2016-09-09 LAB — OB RESULTS CONSOLE GC/CHLAMYDIA
Chlamydia: NEGATIVE
Gonorrhea: NEGATIVE

## 2016-09-09 LAB — OB RESULTS CONSOLE HEPATITIS B SURFACE ANTIGEN: HEP B S AG: NEGATIVE

## 2016-09-09 LAB — OB RESULTS CONSOLE ABO/RH: RH Type: POSITIVE

## 2016-09-09 LAB — OB RESULTS CONSOLE ANTIBODY SCREEN: ANTIBODY SCREEN: NEGATIVE

## 2017-01-16 LAB — OB RESULTS CONSOLE GBS: GBS: NEGATIVE

## 2017-02-13 ENCOUNTER — Encounter (HOSPITAL_COMMUNITY): Payer: Self-pay

## 2017-02-13 ENCOUNTER — Inpatient Hospital Stay (HOSPITAL_COMMUNITY): Payer: BLUE CROSS/BLUE SHIELD | Admitting: Anesthesiology

## 2017-02-13 ENCOUNTER — Encounter (HOSPITAL_COMMUNITY)
Admission: AD | Disposition: A | Discharge status: Home / Self Care | Payer: Self-pay | Source: Ambulatory Visit | Attending: Obstetrics & Gynecology

## 2017-02-13 ENCOUNTER — Inpatient Hospital Stay (HOSPITAL_COMMUNITY)
Admission: AD | Admit: 2017-02-13 | Discharge: 2017-02-16 | DRG: 766 | Disposition: A | Discharge status: Home / Self Care | Payer: BLUE CROSS/BLUE SHIELD | Source: Ambulatory Visit | Attending: Obstetrics & Gynecology | Admitting: Obstetrics & Gynecology

## 2017-02-13 DIAGNOSIS — O9832 Other infections with a predominantly sexual mode of transmission complicating childbirth: Secondary | ICD-10-CM | POA: Diagnosis present

## 2017-02-13 DIAGNOSIS — E669 Obesity, unspecified: Secondary | ICD-10-CM | POA: Diagnosis present

## 2017-02-13 DIAGNOSIS — O99214 Obesity complicating childbirth: Secondary | ICD-10-CM | POA: Diagnosis present

## 2017-02-13 DIAGNOSIS — Z6830 Body mass index (BMI) 30.0-30.9, adult: Secondary | ICD-10-CM

## 2017-02-13 DIAGNOSIS — Z3A39 39 weeks gestation of pregnancy: Secondary | ICD-10-CM

## 2017-02-13 DIAGNOSIS — Z98891 History of uterine scar from previous surgery: Secondary | ICD-10-CM

## 2017-02-13 DIAGNOSIS — O26893 Other specified pregnancy related conditions, third trimester: Secondary | ICD-10-CM | POA: Diagnosis present

## 2017-02-13 DIAGNOSIS — A6000 Herpesviral infection of urogenital system, unspecified: Secondary | ICD-10-CM | POA: Diagnosis present

## 2017-02-13 HISTORY — DX: Herpesviral infection, unspecified: B00.9

## 2017-02-13 LAB — TYPE AND SCREEN
ABO/RH(D): O POS
ANTIBODY SCREEN: NEGATIVE

## 2017-02-13 LAB — CBC
HEMATOCRIT: 34.6 % — AB (ref 36.0–46.0)
Hemoglobin: 11.4 g/dL — ABNORMAL LOW (ref 12.0–15.0)
MCH: 27.3 pg (ref 26.0–34.0)
MCHC: 32.9 g/dL (ref 30.0–36.0)
MCV: 83 fL (ref 78.0–100.0)
PLATELETS: 288 10*3/uL (ref 150–400)
RBC: 4.17 MIL/uL (ref 3.87–5.11)
RDW: 16 % — AB (ref 11.5–15.5)
WBC: 9.5 10*3/uL (ref 4.0–10.5)

## 2017-02-13 SURGERY — Surgical Case
Anesthesia: Spinal

## 2017-02-13 MED ORDER — PHENYLEPHRINE HCL 10 MG/ML IJ SOLN
INTRAVENOUS | Status: DC | PRN
  Administered 2017-02-13: 60 ug/min via INTRAVENOUS

## 2017-02-13 MED ORDER — SOD CITRATE-CITRIC ACID 500-334 MG/5ML PO SOLN
ORAL | Status: AC
  Administered 2017-02-13: 30 mL
  Filled 2017-02-13: qty 15

## 2017-02-13 MED ORDER — DEXAMETHASONE SODIUM PHOSPHATE 10 MG/ML IJ SOLN
INTRAMUSCULAR | Status: AC
  Filled 2017-02-13: qty 1

## 2017-02-13 MED ORDER — SODIUM CHLORIDE 0.9 % IR SOLN
Status: DC | PRN
  Administered 2017-02-13: 1000 mL

## 2017-02-13 MED ORDER — BUPIVACAINE IN DEXTROSE 0.75-8.25 % IT SOLN
INTRATHECAL | Status: DC | PRN
  Administered 2017-02-13: 1.5 mL via INTRATHECAL

## 2017-02-13 MED ORDER — LACTATED RINGERS IV SOLN
INTRAVENOUS | Status: DC | PRN
  Administered 2017-02-13: 40 [IU] via INTRAVENOUS

## 2017-02-13 MED ORDER — SOD CITRATE-CITRIC ACID 500-334 MG/5ML PO SOLN: 30.0000 mL | ORAL | Status: DC

## 2017-02-13 MED ORDER — SCOPOLAMINE 1 MG/3DAYS TD PT72
MEDICATED_PATCH | TRANSDERMAL | Status: DC | PRN
  Administered 2017-02-13: 1 via TRANSDERMAL

## 2017-02-13 MED ORDER — FENTANYL CITRATE (PF) 100 MCG/2ML IJ SOLN
INTRAMUSCULAR | Status: DC | PRN
  Administered 2017-02-13: 20 ug via INTRATHECAL
  Administered 2017-02-13: 80 ug via INTRAVENOUS

## 2017-02-13 MED ORDER — CEFAZOLIN SODIUM-DEXTROSE 2-4 GM/100ML-% IV SOLN
2.0000 g | INTRAVENOUS | Status: AC
  Administered 2017-02-13: 2 g via INTRAVENOUS

## 2017-02-13 MED ORDER — LACTATED RINGERS IV SOLN
INTRAVENOUS | Status: DC | PRN
  Administered 2017-02-13: 22:00:00 via INTRAVENOUS

## 2017-02-13 MED ORDER — ONDANSETRON HCL 4 MG/2ML IJ SOLN
INTRAMUSCULAR | Status: DC | PRN
  Administered 2017-02-13: 4 mg via INTRAVENOUS

## 2017-02-13 MED ORDER — SCOPOLAMINE 1 MG/3DAYS TD PT72
MEDICATED_PATCH | TRANSDERMAL | Status: AC
  Filled 2017-02-13: qty 1

## 2017-02-13 MED ORDER — SOD CITRATE-CITRIC ACID 500-334 MG/5ML PO SOLN: 30.0000 mL | Freq: Once | ORAL | Status: DC

## 2017-02-13 MED ORDER — DEXAMETHASONE SODIUM PHOSPHATE 10 MG/ML IJ SOLN
INTRAMUSCULAR | Status: DC | PRN
  Administered 2017-02-13: 10 mg via INTRAVENOUS

## 2017-02-13 MED ORDER — LACTATED RINGERS IV SOLN
INTRAVENOUS | Status: DC | PRN
  Administered 2017-02-13: 23:00:00 via INTRAVENOUS

## 2017-02-13 MED ORDER — MORPHINE SULFATE (PF) 0.5 MG/ML IJ SOLN
INTRAMUSCULAR | Status: AC
  Filled 2017-02-13: qty 10

## 2017-02-13 MED ORDER — ONDANSETRON HCL 4 MG/2ML IJ SOLN
INTRAMUSCULAR | Status: AC
  Filled 2017-02-13: qty 2

## 2017-02-13 MED ORDER — BUPIVACAINE HCL (PF) 0.25 % IJ SOLN
INTRAMUSCULAR | Status: DC | PRN
  Administered 2017-02-13: 30 mL

## 2017-02-13 MED ORDER — MORPHINE SULFATE (PF) 0.5 MG/ML IJ SOLN
INTRAMUSCULAR | Status: DC | PRN
  Administered 2017-02-13: 4.8 mg via EPIDURAL
  Administered 2017-02-13: .2 mg via INTRATHECAL

## 2017-02-13 MED ORDER — FENTANYL CITRATE (PF) 100 MCG/2ML IJ SOLN
INTRAMUSCULAR | Status: AC
  Filled 2017-02-13: qty 2

## 2017-02-13 SURGICAL SUPPLY — 46 items
BENZOIN TINCTURE PRP APPL 2/3 (GAUZE/BANDAGES/DRESSINGS) ×3 IMPLANT
CHLORAPREP W/TINT 26ML (MISCELLANEOUS) ×3 IMPLANT
CLAMP CORD UMBIL (MISCELLANEOUS) IMPLANT
CLOSURE STERI-STRIP 1/2X4 (GAUZE/BANDAGES/DRESSINGS) ×1
CLOSURE WOUND 1/2 X4 (GAUZE/BANDAGES/DRESSINGS) ×1
CLOTH BEACON ORANGE TIMEOUT ST (SAFETY) ×3 IMPLANT
CLSR STERI-STRIP ANTIMIC 1/2X4 (GAUZE/BANDAGES/DRESSINGS) ×2 IMPLANT
DECANTER SPIKE VIAL GLASS SM (MISCELLANEOUS) ×3 IMPLANT
DEVICE BLD TRNS LUER ATTCH (MISCELLANEOUS) ×3 IMPLANT
DRSG OPSITE POSTOP 4X10 (GAUZE/BANDAGES/DRESSINGS) ×3 IMPLANT
ELECT REM PT RETURN 9FT ADLT (ELECTROSURGICAL) ×3
ELECTRODE REM PT RTRN 9FT ADLT (ELECTROSURGICAL) ×1 IMPLANT
EXTRACTOR VACUUM BELL STYLE (SUCTIONS) IMPLANT
EXTRACTOR VACUUM KIWI (MISCELLANEOUS) IMPLANT
GAUZE SPONGE 4X4 12PLY STRL LF (GAUZE/BANDAGES/DRESSINGS) ×3 IMPLANT
GLOVE BIO SURGEON STRL SZ 6.5 (GLOVE) ×2 IMPLANT
GLOVE BIO SURGEONS STRL SZ 6.5 (GLOVE) ×1
GLOVE BIOGEL PI IND STRL 7.0 (GLOVE) ×2 IMPLANT
GLOVE BIOGEL PI INDICATOR 7.0 (GLOVE) ×4
GOWN STRL REUS W/TWL LRG LVL3 (GOWN DISPOSABLE) ×9 IMPLANT
HEMOSTAT ARISTA ABSORB 3G PWDR (MISCELLANEOUS) ×6 IMPLANT
KIT ABG SYR 3ML LUER SLIP (SYRINGE) IMPLANT
NEEDLE HYPO 22GX1.5 SAFETY (NEEDLE) IMPLANT
NEEDLE HYPO 25X5/8 SAFETYGLIDE (NEEDLE) IMPLANT
NS IRRIG 1000ML POUR BTL (IV SOLUTION) ×3 IMPLANT
PACK C SECTION WH (CUSTOM PROCEDURE TRAY) ×3 IMPLANT
PAD ABD 7.5X8 STRL (GAUZE/BANDAGES/DRESSINGS) ×3 IMPLANT
PAD OB MATERNITY 4.3X12.25 (PERSONAL CARE ITEMS) ×3 IMPLANT
PENCIL SMOKE EVAC W/HOLSTER (ELECTROSURGICAL) ×3 IMPLANT
RTRCTR C-SECT PINK 25CM LRG (MISCELLANEOUS) ×3 IMPLANT
SPONGE LAP 18X18 X RAY DECT (DISPOSABLE) ×3 IMPLANT
STRIP CLOSURE SKIN 1/2X4 (GAUZE/BANDAGES/DRESSINGS) ×2 IMPLANT
SUT CHROMIC 2 0 CT 1 (SUTURE) ×6 IMPLANT
SUT MNCRL 0 VIOLET CTX 36 (SUTURE) ×2 IMPLANT
SUT MNCRL AB 3-0 PS2 27 (SUTURE) ×3 IMPLANT
SUT MON AB 2-0 CT1 27 (SUTURE) ×6 IMPLANT
SUT MONOCRYL 0 CTX 36 (SUTURE) ×4
SUT PDS AB 0 CTX 36 PDP370T (SUTURE) IMPLANT
SUT PLAIN 2 0 (SUTURE)
SUT PLAIN ABS 2-0 CT1 27XMFL (SUTURE) IMPLANT
SUT VIC AB 0 CTX 36 (SUTURE) ×4
SUT VIC AB 0 CTX36XBRD ANBCTRL (SUTURE) ×2 IMPLANT
SUT VIC AB 4-0 KS 27 (SUTURE) ×3 IMPLANT
SYR CONTROL 10ML LL (SYRINGE) IMPLANT
TOWEL OR 17X24 6PK STRL BLUE (TOWEL DISPOSABLE) ×3 IMPLANT
TRAY FOLEY BAG SILVER LF 14FR (SET/KITS/TRAYS/PACK) ×3 IMPLANT

## 2017-02-13 NOTE — H&P (Signed)
Barbaraann CaoKristin Ma is a 31 y.o. female G3P2002 at 39 weeks 5 days presenting for active labor / contractions. No VB, No LOF.   Upon inquiry if patient was taking her Valtrex she responded to the RN "No"  OB History    Gravida Para Term Preterm AB Living   6 2 2   2 2    SAB TAB Ectopic Multiple Live Births   1 1   0 2     Past Medical History:  Diagnosis Date  . Chlamydia contact, treated 2010  . Herpes    Past Surgical History:  Procedure Laterality Date  . WISDOM TOOTH EXTRACTION     Family History: family history includes Depression in her mother; Heart disease in her maternal grandfather and maternal grandmother; Hypertension in her maternal grandmother and mother. Social History:  reports that she has never smoked. She has never used smokeless tobacco. She reports that she drinks alcohol. She reports that she does not use drugs.     Maternal Diabetes: No Genetic Screening: Normal Maternal Ultrasounds/Referrals: Normal Fetal Ultrasounds or other Referrals:  None Maternal Substance Abuse:  No Significant Maternal Medications:  None Significant Maternal Lab Results:  Lab values include: Group B Strep negative Other Comments:  HSV +  Review of Systems  Constitutional: Negative.   Eyes: Negative.   All other systems reviewed and are negative.  Maternal Medical History:  Reason for admission: Contractions.   Contractions: Onset was 3-5 hours ago.   Perceived severity is strong.    Fetal activity: Perceived fetal activity is normal.   Last perceived fetal movement was within the past hour.    Prenatal complications: Infection.   Prenatal Complications - Diabetes: none.    Dilation: 7 Effacement (%): 90 Station: -2 Exam by:: benji Forensic scientiststanley RN Blood pressure 132/69, pulse 85, temperature 98.8 F (37.1 C), temperature source Oral, resp. rate 18, height 5\' 4"  (1.626 m), weight 81.6 kg (180 lb), SpO2 99 %, unknown if currently breastfeeding. Maternal Exam:  Uterine  Assessment: Contraction strength is moderate.  Contraction duration is 60 seconds. Contraction frequency is regular.   Abdomen: Patient reports no abdominal tenderness. Fundal height is 39cm.   Fetal presentation: vertex  Introitus: Vulva is positive for lesion. Vagina is positive for vaginal discharge.    Fetal Exam Fetal Monitor Review: Baseline rate: 140.  Variability: moderate (6-25 bpm).   Pattern: accelerations present and no decelerations.    Fetal State Assessment: Category I - tracings are normal.     Physical Exam  Genitourinary: Vulva exhibits lesion. Vaginal discharge found.    Prenatal labs: ABO, Rh: O/Positive/-- (04/03 0000) Antibody: Negative (04/03 0000) Rubella: Immune (04/03 0000) RPR: Nonreactive (04/03 0000)  HBsAg: Negative (04/03 0000)  HIV: Non-reactive (04/03 0000)  GBS: Negative (08/10 0000)   Assessment/Plan: 31 yo G3P2002 at 39 weeks 5 days in active labor but a active herpetic lesion Discussed with patient we need to proceed with a primary c-section Risks of surgery explained to patient. She voiced understanding and agrees to proceed. Consent signed and is in chart.    Essie HartINN, Aydin Cavalieri STACIA 02/13/2017, 9:54 PM

## 2017-02-13 NOTE — Anesthesia Preprocedure Evaluation (Addendum)
Anesthesia Evaluation  Patient identified by MRN, date of birth, ID band Patient awake    Reviewed: Allergy & Precautions, NPO status , Patient's Chart, lab work & pertinent test results  Airway Mallampati: III  TM Distance: >3 FB Neck ROM: Full    Dental no notable dental hx. (+) Teeth Intact   Pulmonary neg pulmonary ROS,    Pulmonary exam normal breath sounds clear to auscultation       Cardiovascular negative cardio ROS Normal cardiovascular exam Rhythm:Regular Rate:Normal     Neuro/Psych negative neurological ROS  negative psych ROS   GI/Hepatic Neg liver ROS, GERD  ,  Endo/Other  Obesity  Renal/GU negative Renal ROS  negative genitourinary   Musculoskeletal negative musculoskeletal ROS (+)   Abdominal (+) + obese,   Peds  Hematology  (+) anemia ,   Anesthesia Other Findings   Reproductive/Obstetrics (+) Pregnancy 40 1/7 weeks HSV outbreak Active Labor 7cm dilated                            Anesthesia Physical Anesthesia Plan  ASA: II and emergent  Anesthesia Plan: Spinal   Post-op Pain Management:    Induction:   PONV Risk Score and Plan: 4 or greater and Ondansetron, Dexamethasone, Midazolam, Scopolamine patch - Pre-op and Propofol infusion  Airway Management Planned: Natural Airway  Additional Equipment:   Intra-op Plan:   Post-operative Plan:   Informed Consent: I have reviewed the patients History and Physical, chart, labs and discussed the procedure including the risks, benefits and alternatives for the proposed anesthesia with the patient or authorized representative who has indicated his/her understanding and acceptance.   Dental advisory given  Plan Discussed with: CRNA, Anesthesiologist and Surgeon  Anesthesia Plan Comments:         Anesthesia Quick Evaluation

## 2017-02-13 NOTE — Progress Notes (Signed)
Pt denies having an outbreak at this time, initially said she had not picked up Valtrex yet but says she misunderstood me and that she has been taking Valtrex but "has missed 5-6 pills".  Notified Dr Mora ApplPinn.

## 2017-02-13 NOTE — Anesthesia Procedure Notes (Signed)
Spinal  Patient location during procedure: OR Start time: 02/13/2017 10:10 PM Staffing Anesthesiologist: Mal AmabileFOSTER, Catlynn Grondahl Performed: anesthesiologist  Preanesthetic Checklist Completed: patient identified, site marked, surgical consent, pre-op evaluation, timeout performed, IV checked, risks and benefits discussed and monitors and equipment checked Spinal Block Patient position: sitting Prep: site prepped and draped and DuraPrep Patient monitoring: heart rate, cardiac monitor, continuous pulse ox and blood pressure Approach: midline Location: L3-4 Injection technique: single-shot Needle Needle type: Tuohy and Spinocan  Needle gauge: 25 G Needle length: 9 cm Needle insertion depth: 6 cm Assessment Sensory level: T4 Additional Notes Attempt x 3. Poor positioning made it difficult. Patient tolerated procedure well. Adequate sensory level.

## 2017-02-13 NOTE — Transfer of Care (Signed)
Immediate Anesthesia Transfer of Care Note  Patient: Holly Reeves  Procedure(s) Performed: Procedure(s): CESAREAN SECTION (N/A)  Patient Location: PACU  Anesthesia Type:Spinal  Level of Consciousness: awake  Airway & Oxygen Therapy: Patient Spontanous Breathing  Post-op Assessment: Report given to RN and Post -op Vital signs reviewed and stable  Post vital signs: Reviewed and stable  Last Vitals:  Vitals:   02/13/17 2059  BP: 132/69  Pulse: 85  Resp: 18  Temp: 37.1 C  SpO2: 99%    Last Pain:  Vitals:   02/13/17 2100  TempSrc:   PainSc: 10-Worst pain ever      Patients Stated Pain Goal: 0 (02/13/17 2100)  Complications: No apparent anesthesia complications

## 2017-02-13 NOTE — MAU Note (Signed)
At office today and was 3-4 cm. Contractions every 4 min. No leaking. Mucus discharge after exam at office. Baby moving well.

## 2017-02-14 ENCOUNTER — Encounter (HOSPITAL_COMMUNITY): Payer: Self-pay | Admitting: *Deleted

## 2017-02-14 DIAGNOSIS — Z98891 History of uterine scar from previous surgery: Secondary | ICD-10-CM

## 2017-02-14 LAB — CBC
HCT: 33.5 % — ABNORMAL LOW (ref 36.0–46.0)
Hemoglobin: 11.2 g/dL — ABNORMAL LOW (ref 12.0–15.0)
MCH: 27.6 pg (ref 26.0–34.0)
MCHC: 33.4 g/dL (ref 30.0–36.0)
MCV: 82.5 fL (ref 78.0–100.0)
PLATELETS: 282 10*3/uL (ref 150–400)
RBC: 4.06 MIL/uL (ref 3.87–5.11)
RDW: 16 % — AB (ref 11.5–15.5)
WBC: 16.8 10*3/uL — ABNORMAL HIGH (ref 4.0–10.5)

## 2017-02-14 LAB — RPR: RPR: NONREACTIVE

## 2017-02-14 MED ORDER — KETOROLAC TROMETHAMINE 30 MG/ML IJ SOLN: 30.0000 mg | Freq: Four times a day (QID) | INTRAMUSCULAR | Status: AC | PRN

## 2017-02-14 MED ORDER — LACTATED RINGERS IV SOLN
INTRAVENOUS | Status: DC
  Administered 2017-02-14: 03:00:00 via INTRAVENOUS

## 2017-02-14 MED ORDER — NALBUPHINE HCL 10 MG/ML IJ SOLN
5.0000 mg | INTRAMUSCULAR | Status: DC | PRN
  Administered 2017-02-14: 5 mg via INTRAVENOUS
  Filled 2017-02-14: qty 1

## 2017-02-14 MED ORDER — FENTANYL CITRATE (PF) 100 MCG/2ML IJ SOLN: 25.0000 ug | INTRAMUSCULAR | Status: DC | PRN

## 2017-02-14 MED ORDER — METOCLOPRAMIDE HCL 5 MG/ML IJ SOLN: 10.0000 mg | Freq: Once | INTRAMUSCULAR | Status: DC | PRN

## 2017-02-14 MED ORDER — NALOXONE HCL 0.4 MG/ML IJ SOLN: 0.4000 mg | INTRAMUSCULAR | Status: DC | PRN

## 2017-02-14 MED ORDER — KETOROLAC TROMETHAMINE 30 MG/ML IJ SOLN
INTRAMUSCULAR | Status: AC
  Administered 2017-02-14: 30 mg via INTRAMUSCULAR
  Filled 2017-02-14: qty 1

## 2017-02-14 MED ORDER — WITCH HAZEL-GLYCERIN EX PADS: 1.0000 "application " | MEDICATED_PAD | CUTANEOUS | Status: DC | PRN

## 2017-02-14 MED ORDER — TETANUS-DIPHTH-ACELL PERTUSSIS 5-2.5-18.5 LF-MCG/0.5 IM SUSP
0.5000 mL | Freq: Once | INTRAMUSCULAR | Status: DC
Start: 2017-02-15 — End: 2017-02-16

## 2017-02-14 MED ORDER — SODIUM CHLORIDE 0.9% FLUSH: 3.0000 mL | INTRAVENOUS | Status: DC | PRN

## 2017-02-14 MED ORDER — SIMETHICONE 80 MG PO CHEW: 80.0000 mg | CHEWABLE_TABLET | ORAL | Status: DC | PRN

## 2017-02-14 MED ORDER — HYDROMORPHONE HCL 1 MG/ML IJ SOLN: 0.5000 mg | INTRAMUSCULAR | Status: DC | PRN

## 2017-02-14 MED ORDER — DIPHENHYDRAMINE HCL 50 MG/ML IJ SOLN: 12.5000 mg | INTRAMUSCULAR | Status: DC | PRN

## 2017-02-14 MED ORDER — DIPHENHYDRAMINE HCL 25 MG PO CAPS
25.0000 mg | ORAL_CAPSULE | Freq: Four times a day (QID) | ORAL | Status: DC | PRN
  Administered 2017-02-14: 25 mg via ORAL

## 2017-02-14 MED ORDER — KETOROLAC TROMETHAMINE 30 MG/ML IJ SOLN
30.0000 mg | Freq: Four times a day (QID) | INTRAMUSCULAR | Status: AC | PRN
  Administered 2017-02-14: 30 mg via INTRAMUSCULAR

## 2017-02-14 MED ORDER — OXYCODONE-ACETAMINOPHEN 5-325 MG PO TABS
1.0000 | ORAL_TABLET | ORAL | Status: DC | PRN
  Administered 2017-02-15 – 2017-02-16 (×3): 1 via ORAL
  Filled 2017-02-14 (×4): qty 1

## 2017-02-14 MED ORDER — MEPERIDINE HCL 25 MG/ML IJ SOLN: 6.2500 mg | INTRAMUSCULAR | Status: DC | PRN

## 2017-02-14 MED ORDER — PRENATAL MULTIVITAMIN CH
1.0000 | ORAL_TABLET | Freq: Every day | ORAL | Status: DC
  Administered 2017-02-15 – 2017-02-16 (×2): 1 via ORAL
  Filled 2017-02-14 (×2): qty 1

## 2017-02-14 MED ORDER — OXYTOCIN 40 UNITS IN LACTATED RINGERS INFUSION - SIMPLE MED: 2.5000 [IU]/h | INTRAVENOUS | Status: AC

## 2017-02-14 MED ORDER — ACETAMINOPHEN 325 MG PO TABS
650.0000 mg | ORAL_TABLET | ORAL | Status: DC | PRN
  Administered 2017-02-14 (×2): 650 mg via ORAL
  Filled 2017-02-14 (×2): qty 2

## 2017-02-14 MED ORDER — NALBUPHINE HCL 10 MG/ML IJ SOLN: 5.0000 mg | INTRAMUSCULAR | Status: DC | PRN

## 2017-02-14 MED ORDER — DIBUCAINE 1 % RE OINT: 1.0000 "application " | TOPICAL_OINTMENT | RECTAL | Status: DC | PRN

## 2017-02-14 MED ORDER — OXYCODONE-ACETAMINOPHEN 5-325 MG PO TABS: 2.0000 | ORAL_TABLET | ORAL | Status: DC | PRN

## 2017-02-14 MED ORDER — SIMETHICONE 80 MG PO CHEW: 80.0000 mg | CHEWABLE_TABLET | ORAL | Status: DC

## 2017-02-14 MED ORDER — SIMETHICONE 80 MG PO CHEW
80.0000 mg | CHEWABLE_TABLET | Freq: Three times a day (TID) | ORAL | Status: DC
  Administered 2017-02-14 – 2017-02-16 (×7): 80 mg via ORAL
  Filled 2017-02-14 (×7): qty 1

## 2017-02-14 MED ORDER — ZOLPIDEM TARTRATE 5 MG PO TABS: 5.0000 mg | ORAL_TABLET | Freq: Every evening | ORAL | Status: DC | PRN

## 2017-02-14 MED ORDER — DIPHENHYDRAMINE HCL 25 MG PO CAPS
25.0000 mg | ORAL_CAPSULE | ORAL | Status: DC | PRN
  Administered 2017-02-14 (×3): 25 mg via ORAL
  Filled 2017-02-14 (×5): qty 1

## 2017-02-14 MED ORDER — ONDANSETRON HCL 4 MG/2ML IJ SOLN: 4.0000 mg | Freq: Three times a day (TID) | INTRAMUSCULAR | Status: DC | PRN

## 2017-02-14 MED ORDER — BUPIVACAINE HCL (PF) 0.25 % IJ SOLN
INTRAMUSCULAR | Status: AC
  Filled 2017-02-14: qty 30

## 2017-02-14 MED ORDER — COCONUT OIL OIL: 1.0000 "application " | TOPICAL_OIL | Status: DC | PRN

## 2017-02-14 MED ORDER — NALBUPHINE HCL 10 MG/ML IJ SOLN: 5.0000 mg | Freq: Once | INTRAMUSCULAR | Status: DC | PRN

## 2017-02-14 MED ORDER — SENNOSIDES-DOCUSATE SODIUM 8.6-50 MG PO TABS
2.0000 | ORAL_TABLET | ORAL | Status: DC
  Administered 2017-02-14 – 2017-02-16 (×2): 2 via ORAL
  Filled 2017-02-14 (×2): qty 2

## 2017-02-14 MED ORDER — IBUPROFEN 600 MG PO TABS
600.0000 mg | ORAL_TABLET | Freq: Four times a day (QID) | ORAL | Status: DC
  Administered 2017-02-14 – 2017-02-16 (×10): 600 mg via ORAL
  Filled 2017-02-14 (×9): qty 1

## 2017-02-14 MED ORDER — NALOXONE HCL 2 MG/2ML IJ SOSY
1.0000 ug/kg/h | PREFILLED_SYRINGE | INTRAVENOUS | Status: DC | PRN
  Filled 2017-02-14: qty 2

## 2017-02-14 MED ORDER — MENTHOL 3 MG MT LOZG: 1.0000 | LOZENGE | OROMUCOSAL | Status: DC | PRN

## 2017-02-14 NOTE — Brief Op Note (Signed)
02/13/2017  12:12 AM  PATIENT:  Holly Reeves  31 y.o. female  PRE-OPERATIVE DIAGNOSIS:  primary cesarean section due to active herpes lesions  POST-OPERATIVE DIAGNOSIS:  primary cesarean section due to active herpes lesions  PROCEDURE:  Procedure(s): CESAREAN SECTION (N/A)  SURGEON:  Surgeon(s) and Role:    Essie Hart* Jeannia Tatro, MD - Primary  ASSISTANTS: none   ANESTHESIA:   spinal and local  EBL:  Total I/O In: 1400 [I.V.:1400] Out: 1036 [Urine:200; Blood:836]  BLOOD ADMINISTERED:none  DRAINS: Urinary Catheter (Foley)   LOCAL MEDICATIONS USED:  MARCAINE    and Amount: 30 ml  SPECIMEN:  No Specimen  DISPOSITION OF SPECIMEN:  N/A  COUNTS:  YES  TOURNIQUET:  * No tourniquets in log *  DICTATION: .Note written in EPIC  PLAN OF CARE: Admit to inpatient   PATIENT DISPOSITION:  PACU - hemodynamically stable.   Delay start of Pharmacological VTE agent (>24hrs) due to surgical blood loss or risk of bleeding: not applicable

## 2017-02-14 NOTE — Progress Notes (Signed)
Patient's IV got infiltrated at 0500. IV was stopped immediately. IV site clean, dry, and intact.  Notified Dr. Mora ApplPinn, received an order to discontinue the IV.

## 2017-02-14 NOTE — Progress Notes (Signed)
Subjective: Postpartum Day 1: Cesarean Delivery Patient reports incisional pain, tolerating PO and + flatus.    Objective: Vital signs in last 24 hours: Temp:  [97.7 F (36.5 C)-98.8 F (37.1 C)] 98.2 F (36.8 C) (09/08 0500) Pulse Rate:  [51-85] 60 (09/08 0300) Resp:  [9-27] 18 (09/08 0500) BP: (96-132)/(51-74) 110/66 (09/08 0300) SpO2:  [95 %-100 %] 98 % (09/08 0500) Weight:  [81.6 kg (180 lb)] 81.6 kg (180 lb) (09/07 2141)  Physical Exam:  General: alert, cooperative and no distress Lochia: appropriate Uterine Fundus: firm Incision: healing well, no significant drainage, no dehiscence, no significant erythema pressure on DVT Evaluation: No evidence of DVT seen on physical exam. Negative Homan's sign. No cords or calf tenderness. No significant calf/ankle edema.   Recent Labs  02/13/17 2130 02/14/17 0508  HGB 11.4* 11.2*  HCT 34.6* 33.5*    Assessment/Plan: POD #1Status post Cesarean section.   Continue current post op care D/C Foley DC IV PO meds Encourage ambulation Circumcision for baby either today or tomorrow   Holly HartINN, Holly Reeves 02/14/2017, 8:56 AM

## 2017-02-14 NOTE — Op Note (Signed)
Cesarean Section Procedure Note  Holly Reeves  DOB:    Sep 30, 1985  MRN:    [id number]  Date of Surgery:  02/14/2017  Indication: 39 and [redacted] week GA SIUP in active labor positive for active HSV 2   Pre-operative Diagnosis: Active labor w/ acute herpetic outbreak  Post-operative Diagnosis: same  Procedure:  Primary cesarean delivery                         Surgeon: Wynonia HazardPINN, Roshun Klingensmith STACIA, MD  Assistants: None  Anesthesia: Spinal anesthesia  ASA Class: 2  Procedure Details   The patient was counseled about the risks, benefits, complications of the cesarean section. The patient concurred with the proposed plan, giving informed consent.  The site of surgery properly noted/marked. The patient was taken to Operating Room # 1, identified as Holly Reeves and the procedure verified as C-Section Delivery. A Time Out was held and the above information confirmed.  After spinal was found to adequate , the patient was placed in the dorsal supine position with a leftward tilt, draped and prepped in the usual sterile manner. A Pfannenstiel incision was made with a 10 blade scalpel and the incision carried down through the subcutaneous tissue to the fascia.  The fascia was incised in the midline and the fascial incision was extended laterally with Mayo scissors. The superior aspect of the fascial incision was grasped with Coker clamps x2, tented up and the rectus muscles dissected off sharply with the bovie.  The rectus was then dissected off with blunt dissection and Mayo scissors inferiorly. The rectus muscles were separated in the midline. The abdominal peritoneum was identified, tented up between two hemostats and entered sharply using Metzenbaum scissors. The peritoneal opening was bluntly extended with gentle pulling.   The Alexis retractor was then deployed. The vesicouterine peritoneum was identified, tented up, entered sharply with Metzenbaum scissors, and the bladder flap was created  digitally. Scalpel was then used to make a low transverse incision on the uterus which was extended laterally with  blunt dissection. The fetal vertex was identified and delivered from cephalic presentation (direct OP) was a 3694 gram Female with Apgar scores of 8 at one minute and 9 at five minutes. After the umbilical cord was clamped and cut cord blood was obtained for evaluation. The placenta was spontaneously removed intact. The placenta was sent to L&D for disposal.   The uterus was cleared of all clot and debris. The uterine incision was repaired with #0 Monocryl in running locked fashion. A second imbricating suture was performed using the same suture. There was a noted extension along the right broad ligament that was persistently bleeding. Multiple figure of eight sutures of 2-0 Monocryl were placed. The peritoneal edges were then cauterized. Arista powder was placed on the raw peritoneal edges.  The incision was watched for a minute and was hemostatic.  Ovaries and tubes were inspected and normal. The Alexis retractor was removed. The abdominal cavity was cleared of all clot and debris. The abdominal peritoneum was reapproximated with 2-0 chromic  in a running fashion, and the rectus muscles was reapproximated with the same #2 chromic in running fashion. The fascia was closed with 0 Vicryl in a running fashion. The subcuticular layer was irrigated and all bleeders cauterized.  30 mL of 0.25% Marcaine was injected into the subcutaneous layer.    The skin was closed with 4-0 vicryl in a subcuticular fashion using a Mellody DanceKeith needle. The incision was  dressed with benzoine, steri strips and pressure dressing.   Patient tolerated the procedure well and recovered in stable condition following the procedure.  Instrument, sponge, and needle counts were correct prior the abdominal closure and at the conclusion of the case--x3  Findings: Live female infant, Apgars 8/9, clear amniotic fluid, placenta spontaneous  with 3V umbilical cord normal uterus, bilateral tubes and ovaries  Estimated Blood Loss:  IVF:  1400 mL LR         Drains: Foley catheter  Urine output: 200 mL clear         Specimens: Placenta to L&D         Implants: none         Complications:  None; patient tolerated the procedure well.         Disposition: PACU - hemodynamically stable.   [name] STACIA

## 2017-02-14 NOTE — Anesthesia Postprocedure Evaluation (Signed)
Anesthesia Post Note  Patient: Barbaraann CaoKristin Doyel  Procedure(s) Performed: Procedure(s) (LRB): CESAREAN SECTION (N/A)     Patient location during evaluation: PACU Anesthesia Type: Spinal Level of consciousness: awake and alert and oriented Pain management: pain level controlled Vital Signs Assessment: post-procedure vital signs reviewed and stable Respiratory status: spontaneous breathing, nonlabored ventilation and respiratory function stable Cardiovascular status: blood pressure returned to baseline and stable Postop Assessment: no signs of nausea or vomiting, spinal receding, no headache and no backache Anesthetic complications: no    Last Vitals:  Vitals:   02/14/17 0001 02/14/17 0002  BP:    Pulse: 63 64  Resp: (!) 26 20  Temp:    SpO2: 99% 99%    Last Pain:  Vitals:   02/13/17 2345  TempSrc: Oral  PainSc:    Pain Goal: Patients Stated Pain Goal: 0 (02/13/17 2100)               Yogi Arther A.

## 2017-02-15 ENCOUNTER — Encounter (HOSPITAL_COMMUNITY): Payer: Self-pay | Admitting: *Deleted

## 2017-02-15 MED ORDER — HYDROCODONE-ACETAMINOPHEN 7.5-325 MG PO TABS: 1.0000 | ORAL_TABLET | Freq: Four times a day (QID) | ORAL | 0 refills | Status: AC | PRN

## 2017-02-15 MED ORDER — IBUPROFEN 600 MG PO TABS: 600.0000 mg | ORAL_TABLET | Freq: Four times a day (QID) | ORAL | 3 refills | Status: AC | PRN

## 2017-02-15 NOTE — Progress Notes (Signed)
Subjective: Postpartum Day 2: Cesarean Delivery Patient reports tolerating PO, + flatus, + BM and no problems voiding.   Pain well controlled. She is ambulating w/o difficulty.  No Nausea or vomiting.   Objective: Vital signs in last 24 hours: Temp:  [97.9 F (36.6 C)-98.3 F (36.8 C)] 97.9 F (36.6 C) (09/09 0546) Pulse Rate:  [58-74] 74 (09/09 0546) Resp:  [18] 18 (09/09 0546) BP: (98-112)/(57-62) 98/61 (09/09 0546)  Physical Exam:  General: alert, cooperative and no distress Lochia: appropriate Uterine Fundus: firm Incision: healing well, no significant drainage, no dehiscence, no significant erythema DVT Evaluation: No evidence of DVT seen on physical exam. Negative Homan's sign. No cords or calf tenderness.   Recent Labs  02/13/17 2130 02/14/17 0508  HGB 11.4* 11.2*  HCT 34.6* 33.5*    Assessment/Plan: Status post Cesarean section. Doing well postoperatively.  Discharge home with standard precautions and return to office in a week for  Incision check.   Essie HartINN, Jarita Raval STACIA 02/15/2017, 1:05 PM

## 2017-02-15 NOTE — Discharge Summary (Signed)
Obstetric Discharge Summary Reason for Admission: onset of labor Prenatal Procedures: NST and ultrasound Intrapartum Procedures: cesarean: low cervical, transverse Postpartum Procedures: none Complications-Operative and Postpartum: none Hemoglobin  Date Value Ref Range Status  02/14/2017 11.2 (L) 12.0 - 15.0 g/dL Final   Hemoglobin, fingerstick  Date Value Ref Range Status  05/10/2014 10.8 (L) 12.0 - 16.0 g/dL Final   HCT  Date Value Ref Range Status  02/14/2017 33.5 (L) 36.0 - 46.0 % Final    Physical Exam:  General: alert, cooperative and no distress Lochia: appropriate Uterine Fundus: firm Incision: healing well, no significant drainage, no dehiscence, no significant erythema DVT Evaluation: No evidence of DVT seen on physical exam. Negative Homan's sign. No cords or calf tenderness.  Discharge Diagnoses: Term Pregnancy-delivered  Discharge Information: Date: 02/15/2017 Activity: pelvic rest Diet: routine Medications: PNV, Ibuprofen and Percocet Condition: stable Instructions: refer to practice specific booklet Discharge to: home   Newborn Data: Live born female  Birth Weight: 8 lb 2.3 oz (3694 g) APGAR: 8, 9  Home with mother.  Essie HartINN, Sutter Ahlgren STACIA 02/15/2017, 1:12 PM

## 2017-02-15 NOTE — Progress Notes (Signed)
Discharge cancelled as baby had to stay for elevated bilirubin  Holly Reeves STACIA

## 2017-02-16 NOTE — Progress Notes (Signed)
Spoke to patient about baby boxes, especially since she has her bedrooms on second floor.  Receptive to idea; completed short video, has code.  Jtwells, rn

## 2023-06-04 ENCOUNTER — Other Ambulatory Visit: Payer: Self-pay | Admitting: Obstetrics and Gynecology

## 2023-06-04 DIAGNOSIS — R928 Other abnormal and inconclusive findings on diagnostic imaging of breast: Secondary | ICD-10-CM

## 2023-06-11 ENCOUNTER — Ambulatory Visit
Admission: RE | Admit: 2023-06-11 | Discharge: 2023-06-11 | Disposition: A | Discharge status: Home / Self Care | Payer: Medicaid Other | Source: Ambulatory Visit | Attending: Obstetrics and Gynecology | Admitting: Obstetrics and Gynecology

## 2023-06-11 ENCOUNTER — Ambulatory Visit
Admission: RE | Admit: 2023-06-11 | Discharge: 2023-06-11 | Disposition: A | Discharge status: Home / Self Care | Payer: BC Managed Care – PPO | Source: Ambulatory Visit | Attending: Obstetrics and Gynecology | Admitting: Obstetrics and Gynecology

## 2023-06-11 DIAGNOSIS — R928 Other abnormal and inconclusive findings on diagnostic imaging of breast: Secondary | ICD-10-CM

## 2023-06-24 ENCOUNTER — Other Ambulatory Visit: Payer: Medicaid Other

## 2024-08-03 ENCOUNTER — Ambulatory Visit: Admitting: Physical Therapy
